# Patient Record
Sex: Male | Born: 1955 | Race: Black or African American | Hispanic: No | Marital: Single | Smoking: Current every day smoker
Health system: Southern US, Community
[De-identification: ages and names within clinical notes are randomized; demographics above are authoritative.]

## PROBLEM LIST (undated history)

## (undated) DIAGNOSIS — D472 Monoclonal gammopathy: Secondary | ICD-10-CM

## (undated) DIAGNOSIS — F1010 Alcohol abuse, uncomplicated: Secondary | ICD-10-CM

## (undated) DIAGNOSIS — R480 Dyslexia and alexia: Secondary | ICD-10-CM

## (undated) DIAGNOSIS — B182 Chronic viral hepatitis C: Secondary | ICD-10-CM

## (undated) HISTORY — DX: Chronic viral hepatitis C: B18.2

## (undated) HISTORY — DX: Dyslexia and alexia: R48.0

## (undated) HISTORY — DX: Alcohol abuse, uncomplicated: F10.10

## (undated) HISTORY — DX: Monoclonal gammopathy: D47.2

---

## 2016-07-17 ENCOUNTER — Ambulatory Visit (INDEPENDENT_AMBULATORY_CARE_PROVIDER_SITE_OTHER): Payer: Self-pay | Admitting: Urology

## 2016-07-17 DIAGNOSIS — R972 Elevated prostate specific antigen [PSA]: Secondary | ICD-10-CM

## 2016-07-17 DIAGNOSIS — N3281 Overactive bladder: Secondary | ICD-10-CM

## 2016-07-31 ENCOUNTER — Other Ambulatory Visit: Payer: Self-pay | Admitting: Urology

## 2016-07-31 DIAGNOSIS — R972 Elevated prostate specific antigen [PSA]: Secondary | ICD-10-CM

## 2016-08-14 ENCOUNTER — Ambulatory Visit (HOSPITAL_COMMUNITY)
Admission: RE | Admit: 2016-08-14 | Discharge: 2016-08-14 | Disposition: A | Discharge status: Home / Self Care | Payer: Medicaid Other | Source: Ambulatory Visit | Attending: Urology | Admitting: Urology

## 2016-08-14 ENCOUNTER — Encounter (HOSPITAL_COMMUNITY): Payer: Self-pay

## 2016-09-04 ENCOUNTER — Ambulatory Visit (HOSPITAL_COMMUNITY): Admission: RE | Admit: 2016-09-04 | Payer: Medicaid Other | Source: Ambulatory Visit

## 2017-09-30 HISTORY — PX: COLON SURGERY: SHX602

## 2017-12-27 ENCOUNTER — Telehealth: Payer: Self-pay | Admitting: Gastroenterology

## 2017-12-27 NOTE — Telephone Encounter (Signed)
Routing back to Dustin Booker 

## 2017-12-27 NOTE — Telephone Encounter (Signed)
Per Erline Levine they will refax the referral

## 2017-12-27 NOTE — Telephone Encounter (Signed)
[facility] called and said they were told we had the referral over a week ago and was inquiring about an appointment for the patient. [phone]

## 2017-12-30 NOTE — Telephone Encounter (Signed)
Referral received and patient scheduled for appointment

## 2018-01-08 ENCOUNTER — Ambulatory Visit: Payer: Medicaid Other | Admitting: Gastroenterology

## 2018-01-13 ENCOUNTER — Encounter: Payer: Self-pay | Admitting: Gastroenterology

## 2018-01-13 ENCOUNTER — Ambulatory Visit (INDEPENDENT_AMBULATORY_CARE_PROVIDER_SITE_OTHER): Payer: Medicaid Other | Admitting: Gastroenterology

## 2018-01-13 DIAGNOSIS — B182 Chronic viral hepatitis C: Secondary | ICD-10-CM | POA: Insufficient documentation

## 2018-01-13 NOTE — Patient Instructions (Signed)
1. Please have your labs and ultrasound done as directed.  2. Once we have results back we will start approval process for hepatitis C drug. 3. Please avoid sharing needles, razors, nail clippers, toothbrushes. Don't sharp pipes, cigarettes.  4. When you start the hepatitis C medication, it is very important to take the medication every day as directed, DO NOT start NEW medications without approving with [place] first to make sure it doesn't interfere with the hepatitis C medication, have your labs done as requested and keep all appointments.  5. Please complete your vaccinations the family doctor is giving you.    Hepatitis C Hepatitis C is a viral infection of the liver. It can lead to scarring of the liver (cirrhosis), liver failure, or liver cancer. Hepatitis C may go undetected for months or years because people with the infection may not have symptoms, or they may have only mild symptoms. What are the causes? This condition is caused by the hepatitis C virus (HCV). The virus can spread from person to person (is contagious) through:  Blood.  Childbirth. A woman who has hepatitis C can pass it to her baby during birth.  Bodily fluids, such as breast milk, tears, semen, vaginal fluids, and saliva.  Blood transfusions or organ transplants done in the [place] before 1992.  What increases the risk? The following factors may make you more likely to develop this condition:  Having contact with unclean (contaminated) needles or syringes. This may result from: ? Acupuncture. ? Tattoing. ? Body piercing. ? Injecting drugs.  Having unprotected sex with someone who is infected.  Needing treatment to filter your blood (kidney dialysis).  Having HIV (human immunodeficiency virus) or AIDS (acquired immunodeficiency syndrome).  Working in a job that involves contact with blood or bodily fluids, such as health care.  What are the signs or symptoms? Symptoms of this condition  include:  Fatigue.  Loss of appetite.  Nausea.  Vomiting.  Abdominal pain.  Dark yellow urine.  Yellowish skin and eyes (jaundice).  Itchy skin.  Clay-colored bowel movements.  Joint pain.  Bleeding and bruising easily.  Fluid building up in your stomach (ascites).  In some cases, you may not have any symptoms. How is this diagnosed? This condition is diagnosed with:  Blood tests.  Other tests to check how well your liver is functioning. They may include: ? Magnetic resonance elastography (MRE). This imaging test uses MRIs and sound waves to measure liver stiffness. ? Transient elastography. This imaging test uses ultrasounds to measure liver stiffness. ? Liver biopsy. This test requires taking a small tissue sample from your liver to examine it under a microscope.  How is this treated? Your health care provider may perform noninvasive tests or a liver biopsy to help decide the best course of treatment. Treatment may include:  Antiviral medicines and other medicines.  Follow-up treatments every 6-12 months for infections or other liver conditions.  Receiving a donated liver (liver transplant).  Follow these instructions at home: Medicines  Take over-the-counter and prescription medicines only as told by your health care provider.  Take your antiviral medicine as told by your health care provider. Do not stop taking the antiviral even if you start to feel better.  Do not take any medicines unless approved by your health care provider, including over-the-counter medicines and birth control pills. Activity  Rest as needed.  Do not have sex unless approved by your health care provider.  Ask your health care provider when you may  return to school or work. Eating and drinking  Eat a balanced diet with plenty of fruits and vegetables, whole grains, and lowfat (lean) meats or non-meat proteins (such as beans or tofu).  Drink enough fluids to keep your urine  clear or pale yellow.  Do not drink alcohol. General instructions  Do not share toothbrushes, nail clippers, or razors.  Wash your hands frequently with soap and water. If soap and water are not available, use hand sanitizer.  Cover any cuts or open sores on your skin to prevent spreading the virus.  Keep all follow-up visits as told by your health care provider. This is important. You may need follow-up visits every 6-12 months. How is this prevented? There is no vaccine for hepatitis C. The only way to prevent the disease is to reduce the risk of exposure to the virus. Make sure you:  Wash your hands frequently with soap and water. If soap and water are not available, use hand sanitizer.  Do not share needles or syringes.  Practice safe sex and use condoms.  Avoid handling blood or bodily fluids without gloves or other protection.  Avoid getting tattoos or piercings in shops or other locations that are not clean.  Contact a health care provider if:  You have a fever.  You develop abdominal pain.  You pass dark urine.  You pass clay-colored stools.  You develop joint pain. Get help right away if:  You have increasing fatigue or weakness.  You lose your appetite.  You cannot eat or drink without vomiting.  You develop jaundice or your jaundice gets worse.  You bruise or bleed easily. Summary  Hepatitis C is a viral infection of the liver. It can lead to scarring of the liver (cirrhosis), liver failure, or liver cancer.  The hepatitis C virus (HCV) causes this condition. The virus can pass from person to person (is contagious).  You should not take any medicines unless approved by your health care provider. This includes over-the-counter medicines and birth control pills. This information is not intended to replace advice given to you by your health care provider. Make sure you discuss any questions you have with your health care provider. Document Released:  03/16/2000 Document Revised: 04/24/2016 Document Reviewed: 04/24/2016 Elsevier Interactive Patient Education  [employer].

## 2018-01-13 NOTE — Progress Notes (Addendum)
Primary Care Physician:  Chapman Fitch, MD Referring provider: Dr. Silvestre Mesi, MD Primary Gastroenterologist:  Barney Drain, MD   Chief Complaint  Patient presents with  . Hepatitis C    HPI:  Dustin Booker is a 62 y.o. male with history of alcohol use, dyslexia, sleep apnea, monoclonal gammopathy here at the request of Dr. Federico Flake at [facility] in [place] for further management of chronic HCV.   Patient has h/o right hemicolectomy for intussusception around 09/2017.  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  Surgery performed by Dr. Burke Keels.  He had subsequently had a postoperative colonoscopy which was unremarkable, by Dr. Burke Keels.  Dr. Federico Flake has obtained multiple labs along the way and detected positive hepatitis C antibody.  Genotype 2B, HCVRNA 64,446 IU/mL.  Patient has been encouraged to decrease alcohol consumption which he has been doing.  Has not completely quit however.  Drinks a 6 pack about every 3 days. May go few days at a time without beer. No liquor.  He also underwent bone marrow biopsy and aspirate back in August with no evidence of multiple myeloma.  Being followed closely by oncology.  Patient states prior to this year he did not know he had hepatitis C.  He has remote illicit drug use including injectable/intranasal use.  His roommate had hepatitis C and apparently was recently treated.  He states that they did share razors and nail clippers.  Patient has never received a blood transfusion.  Does not have tattoos.  He is interested in being treated.  He denies any abdominal pain.  No bowel concerns.  Stools are little bit loose sometimes since his surgery.  No rectal bleeding.  No heartburn or indigestion.  No dysphagia.  Abdominal u/s 10/2017: Liver unremarkable.   He has received 2 of 3 Hep B vaccinations, started  10/2017.  Current Outpatient Medications  Medication Sig Dispense Refill  . HYDROcodone-acetaminophen (NORCO/VICODIN) 5-325 MG tablet Take 1 tablet by mouth as needed.    Marland Kitchen amitriptyline (ELAVIL) 10 MG tablet Take 1 tablet by mouth daily.  2  . b complex vitamins capsule Take 1 capsule by mouth daily.     No current facility-administered medications for this visit.     Allergies as of 01/13/2018  . (No Known Allergies)    Past Medical History:  Diagnosis Date  . Alcohol abuse   . Chronic hepatitis C (HCC)    Genotype 2B  . Dyslexia   . Monoclonal gammopathy     Past Surgical History:  Procedure Laterality Date  . COLON SURGERY  09/2017   Burke Keels.    History reviewed. No pertinent family history.  Social History   Socioeconomic History  . Marital status: Single    Spouse name: Not on file  . Number of children: Not on file  . Years of education: Not on file  . Highest education level: Not on file  Occupational History  . Not on file  Social Needs  . Financial resource strain: Not on file  . Food insecurity:    Worry: Not on file    Inability: Not on file  . Transportation needs:    Medical: Not on file    Non-medical: Not on file  Tobacco Use  . Smoking status: Current Every Day Smoker    Types: Cigarettes  . Smokeless tobacco: Never Used  Substance and Sexual Activity  .  Alcohol use: Yes    Comment: 6 pack beer every other day  . Drug use: Not Currently  . Sexual activity: Not on file  Lifestyle  . Physical activity:    Days per week: Not on file    Minutes per session: Not on file  . Stress: Not on file  Relationships  . Social connections:    Talks on phone: Not on file    Gets together: Not on file    Attends religious service: Not on file    Active member of club or organization: Not on file    Attends meetings of clubs or organizations: Not on file    Relationship status: Not on file  . Intimate partner violence:    Fear of current or  ex partner: Not on file    Emotionally abused: Not on file    Physically abused: Not on file    Forced sexual activity: Not on file  Other Topics Concern  . Not on file  Social History Narrative  . Not on file      ROS:  General: Negative for anorexia, weight loss, fever, chills, fatigue, weakness. Eyes: Negative for vision changes.  ENT: Negative for hoarseness, difficulty swallowing , nasal congestion. CV: Negative for chest pain, angina, palpitations, dyspnea on exertion, peripheral edema.  Respiratory: Negative for dyspnea at rest, dyspnea on exertion, cough, sputum, wheezing.  GI: See history of present illness. GU:  Negative for dysuria, hematuria, urinary incontinence, urinary frequency, nocturnal urination.  MS: Negative for joint pain, low back pain.  Derm: Negative for rash or itching.  Neuro: Negative for weakness, abnormal sensation, seizure, frequent headaches, memory loss, confusion.  Psych: Negative for anxiety, depression, suicidal ideation, hallucinations.  Endo: Negative for unusual weight change.  Heme: Negative for bruising or bleeding. Allergy: Negative for rash or hives.    Physical Examination:  BP 100/67   Pulse (!) 103   Temp (!) 97.2 F (36.2 C) (Oral)   Ht 5' 11.5" (1.816 m)   Wt 127 lb 9.6 oz (57.9 kg)   BMI 17.55 kg/m    General: Well-nourished, well-developed in no acute distress.  Accompanied by sister Head: Normocephalic, atraumatic.   Eyes: Conjunctiva pink, no icterus. Mouth: Oropharyngeal mucosa moist and pink , no lesions erythema or exudate. Neck: Supple without thyromegaly, masses, or lymphadenopathy.  Lungs: Clear to auscultation bilaterally.  Heart: Regular rate and rhythm, no murmurs rubs or gallops.  Abdomen: Bowel sounds are normal, nontender, nondistended, no hepatosplenomegaly or masses, no abdominal bruits or    hernia , no rebound or guarding.   Rectal: Not performed Extremities: No lower extremity edema. No clubbing or  deformities.  Neuro: Alert and oriented x 4 , grossly normal neurologically.  Skin: Warm and dry, no rash or jaundice.   Psych: Alert and cooperative, normal mood and affect.  Labs: 01/06/2018: White blood cell count 8300, hemoglobin 14.4, hematocrit 41.8, MCV 92, platelets 317,000, glucose 121, BUN 7, creatinine 0.95, calcium 10, albumin 4.1, total bilirubin 0.9, alkaline phosphatase 87, AST 42, ALT 16, ferritin 124.  July 2019: Hepatitis C genotype 2B, HCVRNA 64,446 IU/mL, hepatitis A IgM negative, hepatitis B surface antigen negative, hepatitis B core IgM negative, HIV negative.  PSA 7.2. CEA 4.2.  Imaging Studies: No results found.

## 2018-01-13 NOTE — Patient Instructions (Signed)
PA for [place] abd RUQ submitted via [employer]. Case approved. PA# [id number], 01/13/18-02/12/18. Elastography doesn't require PA.

## 2018-01-14 ENCOUNTER — Ambulatory Visit: Payer: Medicaid Other | Admitting: Urology

## 2018-01-14 ENCOUNTER — Encounter: Payer: Self-pay | Admitting: Gastroenterology

## 2018-01-14 DIAGNOSIS — N401 Enlarged prostate with lower urinary tract symptoms: Secondary | ICD-10-CM | POA: Diagnosis not present

## 2018-01-14 DIAGNOSIS — R972 Elevated prostate specific antigen [PSA]: Secondary | ICD-10-CM | POA: Diagnosis not present

## 2018-01-14 DIAGNOSIS — R351 Nocturia: Secondary | ICD-10-CM

## 2018-01-14 NOTE — Assessment & Plan Note (Addendum)
62 year old gentleman presenting for further management of chronic hepatitis C, genotype 2B, treatment nave without evidence of cirrhosis.  History of frequent alcohol abuse.  Patient trying to cut back.  Risk factors for hepatitis C include remote illicit drug use.  Current roommate with Hep C, reportedly treated/cured recently.  Patient recalls sharing razor blades, nail clippers with roommate.  I have discussed precautionary measures to prevent spread of hepatitis C.  We discussed need for further reduction in alcohol use.  Would like to pursue hepatitis C treatment in the near future, additional labs and elastography needed.  We discussed need for compliance with taking medication regularly, calling us prior to adding any additional medications either prescription or over-the-counter, need to keep follow-up appointments.  Await pending labs and elastography.

## 2018-01-15 ENCOUNTER — Other Ambulatory Visit: Payer: Self-pay | Admitting: Urology

## 2018-01-15 DIAGNOSIS — R972 Elevated prostate specific antigen [PSA]: Secondary | ICD-10-CM

## 2018-01-15 NOTE — Progress Notes (Signed)
No pcp per patient 

## 2018-01-20 ENCOUNTER — Ambulatory Visit (HOSPITAL_COMMUNITY): Payer: Medicaid Other

## 2018-01-20 NOTE — Progress Notes (Signed)
cc'ed to [employer] and [employer]

## 2018-01-20 NOTE — Progress Notes (Signed)
Patient has been seeing Dr. Chapman Fitch. Please send copy to Dr. Lorra Hals and Dr. Federico Flake.

## 2018-01-20 NOTE — Progress Notes (Signed)
cc'd to pcp 

## 2018-01-22 ENCOUNTER — Ambulatory Visit (HOSPITAL_COMMUNITY)
Admission: RE | Admit: 2018-01-22 | Discharge: 2018-01-22 | Disposition: A | Discharge status: Home / Self Care | Payer: Medicaid Other | Source: Ambulatory Visit | Attending: Gastroenterology | Admitting: Gastroenterology

## 2018-01-22 DIAGNOSIS — B182 Chronic viral hepatitis C: Secondary | ICD-10-CM

## 2018-01-24 ENCOUNTER — Ambulatory Visit (HOSPITAL_COMMUNITY)
Admission: RE | Admit: 2018-01-24 | Discharge: 2018-01-24 | Disposition: A | Discharge status: Home / Self Care | Payer: Medicaid Other | Source: Ambulatory Visit | Attending: Gastroenterology | Admitting: Gastroenterology

## 2018-01-24 DIAGNOSIS — B182 Chronic viral hepatitis C: Secondary | ICD-10-CM | POA: Diagnosis not present

## 2018-01-30 ENCOUNTER — Telehealth: Payer: Self-pay | Admitting: Gastroenterology

## 2018-01-30 NOTE — Telephone Encounter (Signed)
I called pt and left Vm for a return call. Need to find out if his labs have been done.

## 2018-01-30 NOTE — Telephone Encounter (Signed)
PATIENT CALLED AND SAID THAT THE PROVIDER HERE WAS SUPPOSED TO CALL SOME MEDICATION IN TO TAKE  BUT THEY WERE UNAWARE OF THE NAME OR WHAT IT WAS FOR.  THOUGHT THAT IT NEEDED "APPROVAL" BEFORE IT COULD BE SENT IN.  PLEASE GIVE THEM A CALL

## 2018-02-03 NOTE — Telephone Encounter (Signed)
I spoke to [place] at [facility]. The last labs they have on pt was CBC, CMP and Ferritin done on 01/06/2018 and ordered by another physician.

## 2018-02-03 NOTE — Telephone Encounter (Signed)
I called [facility], just to make sure and spoke to [place] who said they are not showing any orders or results for pt.

## 2018-02-03 NOTE — Telephone Encounter (Signed)
I called [facility] lab and spoke to [facility]. She said nothing is showing in their computer. She said for me to call [facility].

## 2018-02-03 NOTE — Telephone Encounter (Signed)
FYI to Leslie Lewis, PA.  

## 2018-02-03 NOTE — Telephone Encounter (Signed)
He completed the u/s and elastography but has not completed labs that were sent to [facility]. Await for patient to have done as per DS documentation.

## 2018-02-03 NOTE — Telephone Encounter (Signed)
Pt's wife said he had them done at the hospital [facility]) about 2 weeks ago. I will check on results.

## 2018-02-03 NOTE — Telephone Encounter (Signed)
I spoke to pt's wife and informed her that the labs that [facility] ordered have not been completed. She is aware of the labs that were done on 01/06/2018 by another physician. She will have pt go to the lab in the next few days. I am faxing the labs to [facility] also.

## 2018-02-03 NOTE — Telephone Encounter (Signed)
TO LL.

## 2018-02-04 NOTE — Telephone Encounter (Signed)
Noted  

## 2018-02-10 NOTE — Progress Notes (Signed)
LMOM ( both numbers) to call.

## 2018-02-10 NOTE — Progress Notes (Signed)
I called the hospital and no record of labs being done there. I looked on [facility] and no record of labs. I called [facility] and spoke to Orie Rout and no labs done there. I have called pt back and LMOM for a return call.

## 2018-02-10 NOTE — Progress Notes (Signed)
Pt is aware of results. Dustin Booker he did his labs on the following Monday after his office visit ( which would have been 01/20/2018). He did them at the hospital. I will check on those.

## 2018-02-11 NOTE — Progress Notes (Signed)
LMOM to call. Also, mailing a letter to pt with lab orders. Asking him to please complete since we have been unable to locate these results.

## 2018-02-25 ENCOUNTER — Encounter (HOSPITAL_COMMUNITY): Payer: Self-pay

## 2018-02-25 ENCOUNTER — Ambulatory Visit (HOSPITAL_COMMUNITY): Payer: Medicaid Other

## 2018-04-07 NOTE — Telephone Encounter (Signed)
Patient's sister is coming this Thursday by to pick up labs so Mr. Morelos can have them drawn.

## 2018-04-08 ENCOUNTER — Telehealth: Payer: Self-pay | Admitting: Gastroenterology

## 2018-04-08 NOTE — Telephone Encounter (Signed)
Pt's sister was going to stop by and pick up the lab orders on the patient and take him to have them done today. Can you print a copy and bring up front?

## 2018-04-08 NOTE — Telephone Encounter (Signed)
Lab orders printed and left at front for pt.

## 2018-04-10 LAB — HCV FIBROSURE
ALPHA 2-MACROGLOBULINS, QN: 184 mg/dL (ref 110–276)
ALT (SGPT) P5P: 92 IU/L — AB (ref 0–55)
APOLIPOPROTEIN A I: 204 mg/dL — AB (ref 101–178)
Bilirubin, Total: 1.3 mg/dL — ABNORMAL HIGH (ref 0.0–1.2)
Fibrosis Score: 0.67 — ABNORMAL HIGH (ref 0.00–0.21)
GGT: 198 IU/L — ABNORMAL HIGH (ref 0–65)
HAPTOGLOBIN: 27 mg/dL — AB (ref 29–370)
NECROINFLAMMAT ACTIVITY SCORE: 0.67 — AB (ref 0.00–0.17)

## 2018-04-10 LAB — HEPATITIS B CORE ANTIBODY, TOTAL: Hep B Core Total Ab: POSITIVE — AB

## 2018-04-10 LAB — HEPATITIS B SURFACE ANTIBODY,QUALITATIVE: Hep B Surface Ab, Qual: REACTIVE

## 2018-04-10 LAB — HEPATITIS A ANTIBODY, TOTAL: HEP A TOTAL AB: POSITIVE — AB

## 2018-04-30 ENCOUNTER — Telehealth: Payer: Self-pay | Admitting: Gastroenterology

## 2018-04-30 NOTE — Telephone Encounter (Signed)
Pt's sister, Stanton Kidney, called to see if patient's lab results were back yet. Please call [phone]

## 2018-05-01 NOTE — Telephone Encounter (Signed)
Forwarding to Leslie Lewis, PA for results.  

## 2018-05-04 NOTE — Telephone Encounter (Signed)
Please let sister know that labs are showing some fibrosis in the liver. Evidence of prior Hep A and B with immunity.   I am going to discuss his case with [facility] liver care in [place]. He may need to go down to [facility] for treatment given liver fibrosis and other medical issues. Further recommendations to follow.

## 2018-05-05 NOTE — Telephone Encounter (Signed)
LMOM for a return call.  

## 2018-05-06 NOTE — Telephone Encounter (Signed)
PT's sister Stanton Kidney is aware.

## 2018-05-20 NOTE — Telephone Encounter (Signed)
Spoke with [facility] liver care, Dawn Drazek.   Discussed case, patient is appropriate for HCV management here locally.   Given gray zone if F2/F3, she advises annual liver u/s and ov to evaluate for hepatoma and due to ongoing etoh use. Once HCV treatment complete.   Lets bring in back in for follow up ov to get him started on HEP C treatment. Would choose Epclusa over [place] given ongoing etoh use. Per Dawn, avoid etoh and [place].  May use urgent OV IF needed.

## 2018-05-20 NOTE — Telephone Encounter (Signed)
LMOM to call.

## 2018-05-21 NOTE — Telephone Encounter (Signed)
Left Vm that I have a very important message for him and to please return the call.  Mailing a letter to call also.

## 2018-06-02 NOTE — Telephone Encounter (Signed)
Noted  

## 2018-06-02 NOTE — Telephone Encounter (Signed)
Noted. Will forward information to AB for review given upcoming OV on her schedule.

## 2018-06-02 NOTE — Telephone Encounter (Signed)
Pt came by the office and was informed of the message. He preferred a Monday.  He was scheduled an OV appt for 06/30/2018 with Roseanne Kaufman, NP.

## 2018-06-30 ENCOUNTER — Encounter: Payer: Self-pay | Admitting: Gastroenterology

## 2018-06-30 ENCOUNTER — Other Ambulatory Visit: Payer: Self-pay

## 2018-06-30 ENCOUNTER — Ambulatory Visit (INDEPENDENT_AMBULATORY_CARE_PROVIDER_SITE_OTHER): Payer: Medicaid Other | Admitting: Gastroenterology

## 2018-06-30 DIAGNOSIS — B182 Chronic viral hepatitis C: Secondary | ICD-10-CM

## 2018-06-30 NOTE — Progress Notes (Addendum)
      Virtual Visit via Telephone Note Due to COVID-19, visit is conducted virtually and was requested by patient.   I connected with Dustin Booker on 06/30/18 at  2:00 PM EDT by telephone and verified that I am speaking with the correct person using two identifiers.   I discussed the limitations, risks, security and privacy concerns of performing an evaluation and management service by telephone and the availability of in person appointments. I also discussed with the patient that there may be a patient responsible charge related to this service. The patient expressed understanding and agreed to proceed.  Chief Complaint  Patient presents with  . Hepatitis C     History of Present Illness: History of chronic Hep C genotype 2b, Metavir score F2/F3 with need for annual ultrasound surveillance in light of elevated fibrosis score and alcohol use. Next [place] due in Oct 2020. Plans for Epclusa for treatment. Immune to Hep A and B.   No abdominal pain, jaundice, confusion. Appetite has picked up. No dysphagia. No constipation or diarrhea. No overt GI bleeding. Not drinking alcohol. Stopped 2 months ago. Last Hep B vaccination today.   Past Medical History:  Diagnosis Date  . Alcohol abuse   . Chronic hepatitis C (HCC)    Genotype 2B  . Dyslexia   . Monoclonal gammopathy      Past Surgical History:  Procedure Laterality Date  . COLON SURGERY  09/2017   Burke Keels.     Current Meds  Medication Sig  . aspirin 325 MG tablet Take 325 mg by mouth as needed.  Marland Kitchen b complex vitamins capsule Take 1 capsule by mouth. 2-3 times per week  . HYDROcodone-acetaminophen (NORCO/VICODIN) 5-325 MG tablet Take 1 tablet by mouth as needed.       Observations/Objective: No distress. Unable to perform physical exam due to telephone encounter. No video available.   Assessment and Plan: 63 year old male with chronic Hep C genotype 2b, fibrosis F2/F3. Due for [place] in Oct 2020. Applauded on alcohol  cessation. No concerning signs/symptoms. Will pursue Epclusa for 12 weeks. He will need labs 4 weeks into treatment. As no recent labs, I am also updating these today. We will have medication delivered her to the office so we may provide updated sheet when it arrives.   Follow Up Instructions: Will submit for Epclusa Labs today including CBC, CMP, INR Will need labs 4 weeks into treatment Next [place] in Oct 2020   I discussed the assessment and treatment plan with the patient. The patient was provided an opportunity to ask questions and all were answered. The patient agreed with the plan and demonstrated an understanding of the instructions.   The patient was advised to call back or seek an in-person evaluation if the symptoms worsen or if the condition fails to improve as anticipated.  I provided 12 minutes of non-face-to-face time during this encounter. Video capabilities unable at time of visit.   Annitta Needs, PhD, ANP-BC [facility]

## 2018-06-30 NOTE — Patient Instructions (Signed)
Please have blood work done on Monday.   We will go ahead and submit for Epclusa to treat Hepatitis C.  Continue the great work on avoiding alcohol!  When the medication arrives, we will have you pick it up with specific instructions.  Further recommendations to follow!  It was a pleasure to talk to you today. I strive to create trusting relationships with patients to provide genuine, compassionate, and quality care. I value your feedback. If you receive a survey regarding your visit,  I greatly appreciate you taking time to fill this out.   Annitta Needs, PhD, ANP-BC [facility]

## 2018-07-01 ENCOUNTER — Encounter: Payer: Self-pay | Admitting: Gastroenterology

## 2018-07-01 NOTE — Progress Notes (Signed)
CC'ED TO PCP 

## 2018-08-19 ENCOUNTER — Telehealth: Payer: Self-pay

## 2018-08-19 NOTE — Telephone Encounter (Signed)
I am mailing a letter and Medicaid form to be completed by pt to do the Epclusa.  I also put a note at the bottom of the letter to please to the labs that were ordered if he has not done so.  Will put the form for the Epclusa on desk for Dustin Booker to complete.

## 2018-08-20 NOTE — Telephone Encounter (Signed)
Dustin Booker: I have the form and will fill out, but we need the labs done that were ordered at last visit for me to review prior to submitting. I want to get a baseline. He has immunity to Hep B due to natural infection; we will need to follow his labs closely through treatment (4 weeks, 12 weeks, and 3 months post-treatment.

## 2018-08-21 NOTE — Telephone Encounter (Signed)
PT's sister, Ronne Binning, left Vm for a return call @ [phone]. I called and got her VM and left message for a return call.

## 2018-08-26 NOTE — Telephone Encounter (Signed)
I spoke to pt's sister, Stanton Kidney and she will take him to [facility] for his labs. Also, I told Stanton Kidney to check with him and see if he got the Medicaid form to complete so they will pay for his Hep C med. She said she will ask him, he has not mentioned it to her.

## 2018-08-28 LAB — CBC WITH DIFFERENTIAL/PLATELET
BASOS: 1 %
Basophils Absolute: 0 10*3/uL (ref 0.0–0.2)
EOS (ABSOLUTE): 0.1 10*3/uL (ref 0.0–0.4)
EOS: 1 %
HEMOGLOBIN: 13.5 g/dL (ref 13.0–17.7)
Hematocrit: 40 % (ref 37.5–51.0)
IMMATURE GRANULOCYTES: 0 %
Immature Grans (Abs): 0 10*3/uL (ref 0.0–0.1)
LYMPHS: 26 %
Lymphocytes Absolute: 1.9 10*3/uL (ref 0.7–3.1)
MCH: 32.9 pg (ref 26.6–33.0)
MCHC: 33.8 g/dL (ref 31.5–35.7)
MCV: 98 fL — ABNORMAL HIGH (ref 79–97)
MONOCYTES: 13 %
Monocytes Absolute: 0.9 10*3/uL (ref 0.1–0.9)
Neutrophils Absolute: 4.3 10*3/uL (ref 1.4–7.0)
Neutrophils: 59 %
Platelets: 299 10*3/uL (ref 150–450)
RBC: 4.1 x10E6/uL — ABNORMAL LOW (ref 4.14–5.80)
RDW: 13.1 % (ref 11.6–15.4)
WBC: 7.2 10*3/uL (ref 3.4–10.8)

## 2018-08-28 LAB — COMPREHENSIVE METABOLIC PANEL
ALBUMIN: 4.1 g/dL (ref 3.8–4.8)
ALT: 36 IU/L (ref 0–44)
AST: 88 IU/L — ABNORMAL HIGH (ref 0–40)
Albumin/Globulin Ratio: 1.3 (ref 1.2–2.2)
Alkaline Phosphatase: 85 IU/L (ref 39–117)
BUN/Creatinine Ratio: 7 — ABNORMAL LOW (ref 10–24)
BUN: 5 mg/dL — ABNORMAL LOW (ref 8–27)
Bilirubin Total: 0.4 mg/dL (ref 0.0–1.2)
CO2: 19 mmol/L — ABNORMAL LOW (ref 20–29)
Calcium: 9.2 mg/dL (ref 8.6–10.2)
Chloride: 101 mmol/L (ref 96–106)
Creatinine, Ser: 0.72 mg/dL — ABNORMAL LOW (ref 0.76–1.27)
GFR calc Af Amer: 116 mL/min/{1.73_m2} (ref >59)
GFR calc non Af Amer: 101 mL/min/{1.73_m2} (ref >59)
GLOBULIN, TOTAL: 3.1 g/dL (ref 1.5–4.5)
Glucose: 75 mg/dL (ref 65–99)
Potassium: 4 mmol/L (ref 3.5–5.2)
SODIUM: 138 mmol/L (ref 134–144)
Total Protein: 7.2 g/dL (ref 6.0–8.5)

## 2018-08-28 LAB — PROTIME-INR
INR: 0.9 (ref 0.8–1.2)
Prothrombin Time: 10.1 s (ref 9.1–12.0)

## 2018-09-02 ENCOUNTER — Ambulatory Visit: Payer: Medicaid Other | Admitting: Urology

## 2018-09-10 NOTE — Progress Notes (Signed)
Labs reviewed. Please submit for Epclusa as planned.

## 2018-09-17 NOTE — Progress Notes (Signed)
Paperwork has been faxed to [facility].  

## 2018-09-17 NOTE — Progress Notes (Signed)
Paperwork on Dustin Booker's desk to sign.  

## 2018-09-19 NOTE — Telephone Encounter (Signed)
Per Roseanne Kaufman, NP, generic [name] is OK. I called and spoke to the pharmacist, [facility] and informed her.

## 2018-09-19 NOTE — Telephone Encounter (Signed)
VM left on 09/17/2018. Contact person Danae Chen [phone]. Danae Chen is trying to process pt's Hep C medication. RX was written dispense as written and pt's insurance will cover the generic of Epclusa. Danae Chen needs a verbal that it's ok to fill the generic Epclusa.

## 2018-09-23 ENCOUNTER — Telehealth: Payer: Self-pay

## 2018-09-23 NOTE — Telephone Encounter (Signed)
I received a fax from [facility] that they have been trying to contact pt about his delivery. I called [facility] and spoke to Perla in the pharmacy to confirm they are aware to deliver the medication here to the office. She said they are aware and she will make another note also, but they need to speak to the pt. I called the pt and left Vm for a return call and also called his sister, Ronne Binning and left the number for [place] for him to call, [phone].

## 2018-09-25 ENCOUNTER — Telehealth: Payer: Self-pay

## 2018-09-25 NOTE — Telephone Encounter (Signed)
Dustin Booker, the pt's [name] has arrived.   Please provide instructions.

## 2018-09-29 ENCOUNTER — Telehealth: Payer: Self-pay | Admitting: Family

## 2018-09-29 NOTE — Telephone Encounter (Signed)
Take Epclusa once each morning with or without food. Do not take any OTC medications, acid reducers/antacids, etc. Call us prior to taking any new medications. Ensure he is still only taking aspirin, b complex, and Norco. Needs office visit in 4 weeks with CBC, HFP, BMP, INR at that time. May use urgent.

## 2018-09-29 NOTE — Telephone Encounter (Signed)
LMOM for a return call.  

## 2018-09-30 NOTE — Progress Notes (Deleted)
Referring Provider: Sandi Mealy, MD Primary Care Physician:  Sandi Mealy, MD  No chief complaint on file.   HPI:   Dustin Booker is a 63 y.o. male presenting today with a history of chronic hepatitis C, genotype 2b diagnosed in July 2019, Metavir score F2/F3 with need for annual ultrasound surveillance in light of elevated fibrosis score and alcohol use. Next [place] due in Oct 2020. Immune to Hep A and Hep B due to natural infection. Patient also completed Hep B vaccination series in March 2020.  Patient last seen in office in March 2020 and was doing well at that time, had stopped drinking alcohol, with plans for [facility] treatment. Recent labs on 08/27/18 with PT/INR normal, CBC essentially normal, CMP with AST elevated at 88.   Patient presenting today to initiate treatment with Epclusa.     Need to follow labs at 4 weeks, 12 weeks, and 3 months post treatment.     ? Labs needed at 4 weeks. CBC, HFP, BMP, INR, Hep B surface antigen and HBV DNA, quantitative Hep C viral load. Does patient need to return in 4 weeks to office? Are meds mailed to patient?   Past Medical History:  Diagnosis Date  . Alcohol abuse   . Chronic hepatitis C (HCC)    Genotype 2B  . Dyslexia   . Monoclonal gammopathy     Past Surgical History:  Procedure Laterality Date  . COLON SURGERY  09/2017   Burke Keels.    Current Outpatient Medications  Medication Sig Dispense Refill  . aspirin 325 MG tablet Take 325 mg by mouth as needed.    Marland Kitchen b complex vitamins capsule Take 1 capsule by mouth. 2-3 times per week    . HYDROcodone-acetaminophen (NORCO/VICODIN) 5-325 MG tablet Take 1 tablet by mouth as needed.     No current facility-administered medications for this visit.     Allergies as of 10/01/2018  . (No Known Allergies)    Family History  Problem Relation Age of Onset  . Colon cancer Neg Hx     Social History   Socioeconomic History  . Marital status: Single    Spouse  name: Not on file  . Number of children: Not on file  . Years of education: Not on file  . Highest education level: Not on file  Occupational History  . Not on file  Social Needs  . Financial resource strain: Not on file  . Food insecurity    Worry: Not on file    Inability: Not on file  . Transportation needs    Medical: Not on file    Non-medical: Not on file  Tobacco Use  . Smoking status: Current Every Day Smoker    Packs/day: 0.50    Types: Cigarettes  . Smokeless tobacco: Never Used  Substance and Sexual Activity  . Alcohol use: Not Currently    Comment: 6 pack beer every other day  . Drug use: Not Currently  . Sexual activity: Not on file  Lifestyle  . Physical activity    Days per week: Not on file    Minutes per session: Not on file  . Stress: Not on file  Relationships  . Social Herbalist on phone: Not on file    Gets together: Not on file    Attends religious service: Not on file    Active member of club or organization: Not on file    Attends meetings of clubs  or organizations: Not on file    Relationship status: Not on file  Other Topics Concern  . Not on file  Social History Narrative  . Not on file    Review of Systems: Gen: Denies fever, chills, anorexia. Denies fatigue, weakness, weight loss.  CV: Denies chest pain, palpitations, syncope, peripheral edema, and claudication. Resp: Denies dyspnea at rest, cough, wheezing, coughing up blood, and pleurisy. GI: Denies vomiting blood, jaundice, and fecal incontinence.   Denies dysphagia or odynophagia. Derm: Denies rash, itching, dry skin Psych: Denies depression, anxiety, memory loss, confusion. No homicidal or suicidal ideation.  Heme: Denies bruising, bleeding, and enlarged lymph nodes.  Physical Exam: There were no vitals taken for this visit. General:   Alert and oriented. No distress noted. Pleasant and cooperative.  Head:  Normocephalic and atraumatic. Eyes:  Conjuctiva clear  without scleral icterus. Mouth:  Oral mucosa pink and moist. Good dentition. No lesions. Heart:  S1, S2 present without murmurs appreciated. Lungs:  Clear to auscultation bilaterally. No wheezes, rales, or rhonchi. No distress.  Abdomen:  +BS, soft, non-tender and non-distended. No rebound or guarding. No HSM or masses noted. Msk:  Symmetrical without gross deformities. Normal posture. Extremities:  Without edema. Neurologic:  Alert and  oriented x4 Psych:  Alert and cooperative. Normal mood and affect.

## 2018-09-30 NOTE — Telephone Encounter (Signed)
LMOM to call.

## 2018-09-30 NOTE — Telephone Encounter (Signed)
Per Stanton Kidney, pt's sister, pt has apt here tomorrow. I will discuss with him then.

## 2018-09-30 NOTE — Assessment & Plan Note (Deleted)
Take Epclusa once each morning with or without food. Do not take any OTC medications, acid reducers/antacids, etc. Call us prior to taking any new medications to ensure it doesn't interfere with Epclusa. Continue to have labs completed as requested and keep all appointments.   Needs office visit in 4 weeks with CBC, HFP, BMP, INR at that time.

## 2018-10-01 ENCOUNTER — Ambulatory Visit: Payer: Medicaid Other | Admitting: Gastroenterology

## 2018-10-01 ENCOUNTER — Other Ambulatory Visit: Payer: Self-pay

## 2018-10-01 DIAGNOSIS — B182 Chronic viral hepatitis C: Secondary | ICD-10-CM

## 2018-10-01 NOTE — Telephone Encounter (Signed)
Per Vicente Males, pt does not need the appointment today for OV.  He just needs to come by for the medication and instructions.  She is speaking to the schedulers about that.  I called pt and left VM with him and his sister for a return call.  I will address his meds to have paperwork ready when he comes for the medication.

## 2018-10-01 NOTE — Telephone Encounter (Signed)
Pt came by and picked up his Epclusa and instructions. Med list was confirmed as ASA, B Complex and Norco all of which he takes prn. He is aware to call us before he takes any new meds OTC or from a provider.  He will start taking Epclusa on 10/06/2018.  His follow up appt is on 11/05/2018 at 10:00 Am with Aliene Altes, PA ( he prefers Wednesday appointments).  He is aware to do his labs a few days prior to that appointment and they will be mailed to him.  Paperwork signed by pt and will be scanned to epic.

## 2018-10-13 ENCOUNTER — Other Ambulatory Visit: Payer: Self-pay

## 2018-10-13 DIAGNOSIS — B182 Chronic viral hepatitis C: Secondary | ICD-10-CM

## 2018-10-20 NOTE — Telephone Encounter (Signed)
Noted  

## 2018-10-20 NOTE — Telephone Encounter (Signed)
Shayla called from [place], pt's Dustin Booker will be delivered on this Wednesday 10/22/18.

## 2018-10-22 NOTE — Telephone Encounter (Signed)
Received the shipment today #28 tablets.

## 2018-10-23 NOTE — Telephone Encounter (Signed)
Called pt, LMOM for a return call.  Called his sister, Stanton Kidney, and could not leave a VM.   If pt started his Epclusa on 10/06/2018 as planned, he will need to pick this up before his appointment on 11/05/2018.

## 2018-10-23 NOTE — Telephone Encounter (Signed)
Mary called and will have pt call me.

## 2018-10-27 NOTE — Telephone Encounter (Signed)
Tried to call pt and [facility]. Left Vm for a return call from [facility].

## 2018-10-30 NOTE — Telephone Encounter (Signed)
Numerous attempts to reach pt.  I called his sister, Stanton Kidney, again and told her he needs to pick up his medication tomorrow before noon so he will not be without prior to his appointment.  She said his phone has been off and she will have him come tomorrow morning, preferably by 10:00 am.

## 2018-10-31 ENCOUNTER — Other Ambulatory Visit: Payer: Self-pay | Admitting: Gastroenterology

## 2018-10-31 NOTE — Telephone Encounter (Signed)
Pt came by office to pick up medication.  He is aware of his appointment on 11/05/2018 at 10:00 AM.   He just had his lab done this morning.

## 2018-11-01 LAB — CBC WITH DIFFERENTIAL/PLATELET
Basophils Absolute: 0 10*3/uL (ref 0.0–0.2)
Basos: 0 %
EOS (ABSOLUTE): 0.2 10*3/uL (ref 0.0–0.4)
Eos: 2 %
Hematocrit: 41.7 % (ref 37.5–51.0)
Hemoglobin: 14.3 g/dL (ref 13.0–17.7)
Immature Grans (Abs): 0 10*3/uL (ref 0.0–0.1)
Immature Granulocytes: 0 %
Lymphocytes Absolute: 1.8 10*3/uL (ref 0.7–3.1)
Lymphs: 22 %
MCH: 34 pg — ABNORMAL HIGH (ref 26.6–33.0)
MCHC: 34.3 g/dL (ref 31.5–35.7)
MCV: 99 fL — ABNORMAL HIGH (ref 79–97)
Monocytes Absolute: 1 10*3/uL — ABNORMAL HIGH (ref 0.1–0.9)
Monocytes: 13 %
Neutrophils Absolute: 5.1 10*3/uL (ref 1.4–7.0)
Neutrophils: 63 %
Platelets: 252 10*3/uL (ref 150–450)
RBC: 4.2 x10E6/uL (ref 4.14–5.80)
RDW: 12 % (ref 11.6–15.4)
WBC: 8.2 10*3/uL (ref 3.4–10.8)

## 2018-11-01 LAB — BASIC METABOLIC PANEL
BUN/Creatinine Ratio: 9 — ABNORMAL LOW (ref 10–24)
BUN: 7 mg/dL — ABNORMAL LOW (ref 8–27)
CO2: 21 mmol/L (ref 20–29)
Calcium: 9.5 mg/dL (ref 8.6–10.2)
Chloride: 102 mmol/L (ref 96–106)
Creatinine, Ser: 0.82 mg/dL (ref 0.76–1.27)
GFR calc Af Amer: 110 mL/min/{1.73_m2} (ref >59)
GFR calc non Af Amer: 95 mL/min/{1.73_m2} (ref >59)
Glucose: 89 mg/dL (ref 65–99)
Potassium: 4 mmol/L (ref 3.5–5.2)
Sodium: 138 mmol/L (ref 134–144)

## 2018-11-01 LAB — HEPATIC FUNCTION PANEL
ALT: 14 IU/L (ref 0–44)
AST: 33 IU/L (ref 0–40)
Albumin: 4.1 g/dL (ref 3.8–4.8)
Alkaline Phosphatase: 72 IU/L (ref 39–117)
Bilirubin Total: 0.4 mg/dL (ref 0.0–1.2)
Bilirubin, Direct: 0.15 mg/dL (ref 0.00–0.40)
Total Protein: 7.1 g/dL (ref 6.0–8.5)

## 2018-11-01 LAB — SPECIMEN STATUS REPORT

## 2018-11-01 LAB — PROTIME-INR
INR: 1 (ref 0.8–1.2)
Prothrombin Time: 10.3 s (ref 9.1–12.0)

## 2018-11-04 ENCOUNTER — Other Ambulatory Visit: Payer: Self-pay | Admitting: Gastroenterology

## 2018-11-04 NOTE — Assessment & Plan Note (Addendum)
63 y.o. male with history of chronic hepatitis C, genotype 2b, baseline HCV RNA 64,446, Metavir score F2/F3 with need for annual ultrasound surveillance. Immune to Hep A and B. Received Hep B vaccination, but also with history of prior Hep B infection with positive Hep B core Ab and surface Ab, negative Surface Ag. Started generic Epclusa on 10/06/18. Admitted to alcohol use on 7/4, but none since. No illicit drug use. Patient has taken medication daily without missing a dose. No side effects to medication. Labs completed on 7/31 with CBC largely normal other than mild macrocytosis, BMP essentially normal, HFP now completely normal, AST was elevated in May 2020, INR 1.0. HCV RNA was not ordered. Will need to get this. No obvious signs/symptoms of advanced liver disease.   Continue Epclusa daily.  HCV RNA Quantitative today HFP in 4 weeks to ensure LFTs are not elevating due to prior Hep B.  Patient will pick up last 4 weeks of medication at the office.  We will follow-up in office in 8 weeks at the end of treatment as patient is having no trouble at this time. Patient to have labs completed prior to this visit including CBC, CMP, HFP, INR, HCV RNA Quantitative.  Reminded patient on importance of not missing any doses, completing his labs, keeping his follow-up appointments, and not starting any new medications prior to calling us.   Next Abdominal US due in October 2020.

## 2018-11-04 NOTE — Progress Notes (Signed)
Referring Provider: Sandi Mealy, MD Primary Care Physician:  Sandi Mealy, MD Primary GI Physician: Dr. Oneida Alar  Chief Complaint  Patient presents with  . Hepatitis C    HPI:   Dustin Booker is a 63 y.o. male presenting today for follow-up. History of chronic Hep C genotype 2b, baseline HCV RNA 64,446, Metavir score F2/F3 with need for annual ultrasound surveillance due to elevated fibrosis score and history of alcohol use. Next [place] due in Oct 2020. Immune to Hep A and B. Hep B core Ab positive in Jan 2020, surface Ab also positive, surface Ag negative. He completed a series of Hep B vaccination with last dose in March 2020. Patient started on generic Epclusa on 10/06/18 with plans to complete 12 weeks.   Labs completed on 7/31 with CBC largely normal other than mild macrocytosis, BMP essentially normal, HFP now completely normal, AST was elevated in May 2020, INR 1.0. HCV RNA was not ordered. Will need to get this.   Today patient states he started his Epclusa on July 6th. He has taken medication every day without missing a dose. He has already picked up he second 4 week regimen. He is having no adverse reaction to the medication. No abdominal pain, nausea, vomiting, GERD symptoms, constipation, or diarrhea. No hematochezia or melena. No fever, chills, fatigue, unintentional weight loss. No confusion, jaundice, swelling in abdomen or lower extremities. Appetite is good. No new medications. Has not taken aspirin since starting Epclusa. Admits to alcohol use on July 4th, but none since. No current drug use.   Past Medical History:  Diagnosis Date  . Alcohol abuse   . Chronic hepatitis C (HCC)    Genotype 2B  . Dyslexia   . Monoclonal gammopathy     Past Surgical History:  Procedure Laterality Date  . COLON SURGERY  09/2017   Burke Keels.    Current Outpatient Medications  Medication Sig Dispense Refill  . b complex vitamins capsule Take 1 capsule by mouth. 2-3 times per  week    . HYDROcodone-acetaminophen (NORCO/VICODIN) 5-325 MG tablet Take 1 tablet by mouth as needed.    . Sofosbuvir-Velpatasvir (EPCLUSA) 400-100 MG TABS Take 1 tablet by mouth daily.     No current facility-administered medications for this visit.     Allergies as of 11/05/2018  . (No Known Allergies)    Family History  Problem Relation Age of Onset  . Colon cancer Neg Hx     Social History   Socioeconomic History  . Marital status: Single    Spouse name: Not on file  . Number of children: Not on file  . Years of education: Not on file  . Highest education level: Not on file  Occupational History  . Not on file  Social Needs  . Financial resource strain: Not on file  . Food insecurity    Worry: Not on file    Inability: Not on file  . Transportation needs    Medical: Not on file    Non-medical: Not on file  Tobacco Use  . Smoking status: Current Every Day Smoker    Packs/day: 0.50    Types: Cigarettes  . Smokeless tobacco: Never Used  Substance and Sexual Activity  . Alcohol use: Not Currently    Comment: None since 7/4//20  . Drug use: Not Currently  . Sexual activity: Not on file  Lifestyle  . Physical activity    Days per week: Not on file  Minutes per session: Not on file  . Stress: Not on file  Relationships  . Social Herbalist on phone: Not on file    Gets together: Not on file    Attends religious service: Not on file    Active member of club or organization: Not on file    Attends meetings of clubs or organizations: Not on file    Relationship status: Not on file  Other Topics Concern  . Not on file  Social History Narrative  . Not on file    Review of Systems: Gen: No lightheadedness, dizziness, pre-syncope or syncope.  CV: Denies chest pain, palpitations Resp: Denies dyspnea at rest, cough, wheezing GI: See HPI Derm: Denies rash, itching, dry skin Psych: Denies depression, anxiety.  Heme: Denies bruising, bleeding   Physical Exam: BP 109/68   Pulse 92   Temp (!) 97 F (36.1 C) (Oral)   Ht 5\' 11"  (1.803 m)   Wt 118 lb (53.5 kg)   BMI 16.46 kg/m  General:   Alert and oriented. No distress noted. Pleasant and cooperative.  Head:  Normocephalic and atraumatic. Eyes:  Conjuctiva somewhat injected, without scleral icterus. States this is normal.  Heart:  S1, S2 present without murmurs appreciated. Lungs:  Clear to auscultation bilaterally. No wheezes, rales, or rhonchi. No distress.  Abdomen:  +BS, soft, non-tender and non-distended. No rebound or guarding. No HSM or masses noted. Msk:  Symmetrical without gross deformities. Normal posture. Extremities:  Without edema. Neurologic:  Alert and  oriented x4 Psych:  Normal mood and affect.

## 2018-11-04 NOTE — Progress Notes (Signed)
Dustin Booker: HCV RNA wasn't completed for some reason. Can we find out from [facility]?

## 2018-11-04 NOTE — Progress Notes (Signed)
I spoke with Katharine Look and the HCV RNA was not ordered and it cannot be added.

## 2018-11-04 NOTE — Patient Instructions (Addendum)
1. Please have labs completed today at [facility]. We will call you with results.  2. Continue taking Epclusa daily ensuring not to miss a dose.  3. I will also have you complete labs in 4 weeks. We will call to remind you.  4. Be sure to call us prior to adding any additional medications including prescriptive or OTC medications and the importance of keeping follow-up appointments.   5. Avoid sharing toothbrushes and dental or shaving equipment, and be cautioned to cover any bleeding wound to prevent the possibility of others coming into contact with their blood. No donating blood. Clean blood spills with 1part bleach to 9 parts water wearing gloves.   We will see you back in 8 weeks. This will be at the end of treatment. You will also have labs completed prior to the visit.   Aliene Altes, PA-C [facility]

## 2018-11-05 ENCOUNTER — Encounter: Payer: Self-pay | Admitting: Gastroenterology

## 2018-11-05 ENCOUNTER — Other Ambulatory Visit: Payer: Self-pay

## 2018-11-05 ENCOUNTER — Ambulatory Visit (INDEPENDENT_AMBULATORY_CARE_PROVIDER_SITE_OTHER): Payer: Medicaid Other | Admitting: Gastroenterology

## 2018-11-05 DIAGNOSIS — B182 Chronic viral hepatitis C: Secondary | ICD-10-CM

## 2018-11-06 ENCOUNTER — Encounter: Payer: Self-pay | Admitting: Gastroenterology

## 2018-11-07 ENCOUNTER — Telehealth: Payer: Self-pay | Admitting: Gastroenterology

## 2018-11-07 ENCOUNTER — Other Ambulatory Visit: Payer: Self-pay

## 2018-11-07 DIAGNOSIS — Z8619 Personal history of other infectious and parasitic diseases: Secondary | ICD-10-CM

## 2018-11-07 NOTE — Telephone Encounter (Signed)
Doris, can you call patient to see if he has been able to get his blood drawn? He had labs drawn prior to his office visit, but HCV RNA was missed for some reason. I placed the order during the visit, but it doesn't look like he has completed this yet.   Also, he already has labs placed for 8 weeks, but I also need him to have HFP only in 4 weeks due to history of Hep B. Can you place this lab and a reminder to call patient?

## 2018-11-10 NOTE — Telephone Encounter (Signed)
LMOM to call.

## 2018-11-10 NOTE — Progress Notes (Signed)
CC'D TO PCP °

## 2018-11-17 ENCOUNTER — Other Ambulatory Visit: Payer: Self-pay

## 2018-11-17 DIAGNOSIS — Z8619 Personal history of other infectious and parasitic diseases: Secondary | ICD-10-CM

## 2018-11-17 NOTE — Telephone Encounter (Signed)
Pt said the lab had a lot of people in it the day he went and his sister had something she had to do, so he did not wait.  He is planning to go today.  He is aware to do HFP in 4 weeks and I am mailing him the order today.  He is also aware he will repeat the labs 8 weeks from his OV and we will mail those.

## 2018-11-17 NOTE — Telephone Encounter (Signed)
Noted  

## 2018-11-22 LAB — BASIC METABOLIC PANEL
BUN/Creatinine Ratio: 9 — ABNORMAL LOW (ref 10–24)
BUN: 7 mg/dL — ABNORMAL LOW (ref 8–27)
CO2: 24 mmol/L (ref 20–29)
Calcium: 9 mg/dL (ref 8.6–10.2)
Chloride: 101 mmol/L (ref 96–106)
Creatinine, Ser: 0.8 mg/dL (ref 0.76–1.27)
GFR calc Af Amer: 111 mL/min/{1.73_m2} (ref >59)
GFR calc non Af Amer: 96 mL/min/{1.73_m2} (ref >59)
Glucose: 76 mg/dL (ref 65–99)
Potassium: 4.8 mmol/L (ref 3.5–5.2)
Sodium: 137 mmol/L (ref 134–144)

## 2018-11-22 LAB — CBC WITH DIFFERENTIAL/PLATELET
Basophils Absolute: 0 10*3/uL (ref 0.0–0.2)
Basos: 1 %
EOS (ABSOLUTE): 0.1 10*3/uL (ref 0.0–0.4)
Eos: 1 %
Hematocrit: 38.6 % (ref 37.5–51.0)
Hemoglobin: 13.5 g/dL (ref 13.0–17.7)
Immature Grans (Abs): 0 10*3/uL (ref 0.0–0.1)
Immature Granulocytes: 0 %
Lymphocytes Absolute: 1.8 10*3/uL (ref 0.7–3.1)
Lymphs: 30 %
MCH: 34.4 pg — ABNORMAL HIGH (ref 26.6–33.0)
MCHC: 35 g/dL (ref 31.5–35.7)
MCV: 99 fL — ABNORMAL HIGH (ref 79–97)
Monocytes Absolute: 0.7 10*3/uL (ref 0.1–0.9)
Monocytes: 12 %
Neutrophils Absolute: 3.3 10*3/uL (ref 1.4–7.0)
Neutrophils: 56 %
Platelets: 323 10*3/uL (ref 150–450)
RBC: 3.92 x10E6/uL — ABNORMAL LOW (ref 4.14–5.80)
RDW: 11.5 % — ABNORMAL LOW (ref 11.6–15.4)
WBC: 5.8 10*3/uL (ref 3.4–10.8)

## 2018-11-22 LAB — HEPATIC FUNCTION PANEL
ALT: 18 IU/L (ref 0–44)
AST: 51 IU/L — ABNORMAL HIGH (ref 0–40)
Albumin: 3.9 g/dL (ref 3.8–4.8)
Alkaline Phosphatase: 64 IU/L (ref 39–117)
Bilirubin Total: 0.3 mg/dL (ref 0.0–1.2)
Bilirubin, Direct: 0.15 mg/dL (ref 0.00–0.40)
Total Protein: 6.6 g/dL (ref 6.0–8.5)

## 2018-11-22 LAB — PROTIME-INR
INR: 1 (ref 0.8–1.2)
Prothrombin Time: 10.5 s (ref 9.1–12.0)

## 2018-11-22 LAB — HCV RNA QUANT: Hepatitis C Quantitation: NOT DETECTED IU/mL

## 2018-11-23 LAB — HCV RNA QUANT: Hepatitis C Quantitation: NOT DETECTED IU/mL

## 2018-11-24 NOTE — Progress Notes (Signed)
HCV RNA Not Detected on 11/21/18 (6.5 weeks into treatment).

## 2018-12-10 ENCOUNTER — Telehealth: Payer: Self-pay

## 2018-12-10 NOTE — Telephone Encounter (Signed)
I called pt's sister, Stanton Kidney, since he said this morning that his phone was turned off and it might be turned back on tonight. She is aware it is OK for him to take the medications that he was prescribed for his hand. She is aware we are working on the [place] and will call when we know something.

## 2018-12-10 NOTE — Telephone Encounter (Signed)
The medications he was started on for the cut on his hand are fine to take with Epclusa. We just need to get him started back on Epclusa ASAP.

## 2018-12-10 NOTE — Telephone Encounter (Signed)
Pt came by the office thinking he had Epclusa. It has not arrived and he took his last pill on Sunday. I have called [facility] [employer] and spoke to Cornelius who is checking on it and will call me back. He is unsure if it is an insurance issue or what but will call back.  Also, pt had cut his hand and went to Urgent Care. He had prescriptions with him to get filled from yesterday, and wants to know if it will be Ok to take with Epclusa. 1. Keflex 500 mg tid x 7 days 2. Phenergan 25 mg and to take 12.5 mg as directed 3. Motrin 400 mg q 8 hr as needed.   Waiting on return call from [place] and them will route to Aliene Altes, [place] for advice.

## 2018-12-10 NOTE — Progress Notes (Signed)
PT came by the office and is aware of his results and plan. He is aware that we will mail lab orders before due and he will do around the due date.

## 2018-12-10 NOTE — Telephone Encounter (Signed)
Alicia had Vm for me to call in reference to pt's medication. I called [facility] @ [phone] and left vm for a return call. I called [phone] and spoke to [facility] who asked me to fax recent OV notes and labs to do another PA. The other PA expired when they could not get in touch with pt.  I have faxed those notes/labs to [phone] with a note to please [employer] since pt took last pill on 12/07/2018.

## 2018-12-10 NOTE — Telephone Encounter (Signed)
I called back to see what Dustin Booker has found out. He said they made multiple attempts ( 5) to contact pt. They are suppose to ask pt questions before delivering medication to the office. Since they were unable to contact pt they have to update chart, etc. He said that Dustin Booker who works on the prior authorizations is working on this. He will send her a message to get in touch with me.  Sending FYI to Aliene Altes, [place] .

## 2018-12-11 NOTE — Telephone Encounter (Signed)
I spoke with Audrea Muscat at [facility]. The office notes and labs were received and the PA team is working on it. She said it usually takes 24-48 hours to get approved for expedited order. She said they have spoken to the pt's sister and informed her.

## 2018-12-15 ENCOUNTER — Other Ambulatory Visit: Payer: Self-pay

## 2018-12-15 DIAGNOSIS — B182 Chronic viral hepatitis C: Secondary | ICD-10-CM

## 2018-12-15 NOTE — Telephone Encounter (Signed)
I called [facility] and spoke to [facility] and she said they are waiting on authorization from insurance. She will check with them and let me know what to expect.

## 2018-12-15 NOTE — Telephone Encounter (Signed)
Great. Patient needs to come pick medication up when it arrives and start taking as prescribed to complete the last 4 weeks of treatment. We will plan to repeat his labs at the end of treatment as already planned to ensure that his Hepatitis C has been cleared. This will now be around October 13th. Patient needs to be sure he copmpletes the entire course before getting his labs drawn.   Also, we need to push patients next office visit out to around of October 21st or after due to this delay in receiving the third shipment of Epclusa. He should have completed treatment and have labs drawn by that time.

## 2018-12-15 NOTE — Telephone Encounter (Signed)
Received a call from [place], shipment of Dustin Booker is scheduled for delivery to our office on tomorrow 12/16/2018.

## 2018-12-16 NOTE — Telephone Encounter (Signed)
I called pt and told him the medication should be here today, but to not come until I call him. Just wanted him to be able to plan to come. He said if I could not get him to call his sister and he will let her know to expect the call.

## 2018-12-16 NOTE — Telephone Encounter (Signed)
Pt came by and picked up his Epclusa. He takes in the Am so he will start back tomorrow morning. He has not added any new meds since we last talked ( the ones for his finger). He was given new appointment for 01/21/2019 and also given the lab orders to do AFTER he completes the pills, but before his office visit. He will call if any problems.

## 2018-12-16 NOTE — Telephone Encounter (Signed)
PT's sister is aware that the medication has arrived and she will bring him to pick it up.

## 2018-12-22 NOTE — Telephone Encounter (Signed)
Noted  

## 2019-01-07 ENCOUNTER — Ambulatory Visit: Payer: Medicaid Other | Admitting: Gastroenterology

## 2019-01-20 NOTE — Progress Notes (Signed)
Referring Provider: Sandi Mealy, MD Primary Care Physician:  Sandi Mealy, MD Primary GI Physician: Dr. Oneida Alar  Chief Complaint  Patient presents with  . Follow-up    HPI:   Dustin Booker is a 63 y.o. male presenting today with a history of chronic hepatitis C, genotype 2B, baseline HCVRNA 64, 446, Metavir score F2/F3 with need for annual ultrasound surveillance.  Also with history of prior hepatitis B infection with positive hep B core antibody and surface antibody, negative surface antigen.  He was last seen in our office on 11/06/2018 for follow-up of hepatitis C undergoing treatment with Epclusa.  Treatment was started on 10/06/2018.  He was doing well at that time taking his medication daily and without any side effects.  No obvious signs or symptoms of advanced liver disease.  He had labs drawn prior to that visit but HCVRNA was not ordered.  His CMP was largely normal other than mild microcytosis, BMP essentially normal, HFP normal, and INR normal.  Plans to continue Epclusa, recheck HCV RNA quantitative, HFP in 4 weeks with history of prior hep B, and follow-up in 8 weeks at the end of treatment with labs prior to visit.  Labs completed on 11/24/2018 with HCV RNA not detected, HFP with slight elevation of AST at 51 otherwise normal. Hemoglobin and platelets normal. BMP essentially normal. INR normal.   Telephone note on 12/10/2018 reporting patient coming to the office to pick up Epclusa that had not arrived yet.  Patient had taken his last dose of Epclusa on 12/07/2018.  There was an issue with the medical supply company getting in touch with the patient to answer certain questions before delivering the medication.  Since they were not able to get in touch with the patient, his prior PA expired and another PA had to be submitted.  Ultimately, [name] was delivered on 12/16/2018 and patient picked medication the same day.  Recommended labs prior to office visit have not yet been  completed.  Today he states he has completed Epclusa. Last dose was 01/19/19. States he is doing well. No acute complaints. No abdominal pain, no swelling in lower legs or abdomen, no yellowing eyes or skin,  confusion, or dark urine. BMs daily. No constipation or diarrhea. No blood in the stool or black stools. No GERD symptoms, nausea, vomiting, or trouble swallowing. Has gained about 10 lbs since last visit.     Started drinking again a couple months ago. Drinking 6 pack of beer a day. Marijuana 1-2 times a week. No IV or intranasal.    Not interested in colonoscopy at this time.   No fever, chills, lightheadedness, dizziness, or feeling like he will pass out. No chest pain, palpitations. Occasional exertional SOB. No SOB at rest. Occasional cough likely related to smoking.    Past Medical History:  Diagnosis Date  . Alcohol abuse   . Chronic hepatitis C (HCC)    Genotype 2B  . Dyslexia   . Monoclonal gammopathy     Past Surgical History:  Procedure Laterality Date  . COLON SURGERY  09/2017   Burke Keels.    Current Outpatient Medications  Medication Sig Dispense Refill  . b complex vitamins capsule Take 1 capsule by mouth. 2-3 times per week     No current facility-administered medications for this visit.     Allergies as of 01/21/2019  . (No Known Allergies)    Family History  Problem Relation Age of Onset  . Colon cancer  Neg Hx     Social History   Socioeconomic History  . Marital status: Single    Spouse name: Not on file  . Number of children: Not on file  . Years of education: Not on file  . Highest education level: Not on file  Occupational History  . Not on file  Social Needs  . Financial resource strain: Not on file  . Food insecurity    Worry: Not on file    Inability: Not on file  . Transportation needs    Medical: Not on file    Non-medical: Not on file  Tobacco Use  . Smoking status: Current Every Day Smoker    Packs/day: 0.50    Types:  Cigarettes  . Smokeless tobacco: Never Used  Substance and Sexual Activity  . Alcohol use: Yes    Comment: 6 pack of beer a day  . Drug use: Yes    Types: Marijuana  . Sexual activity: Not on file  Lifestyle  . Physical activity    Days per week: Not on file    Minutes per session: Not on file  . Stress: Not on file  Relationships  . Social Herbalist on phone: Not on file    Gets together: Not on file    Attends religious service: Not on file    Active member of club or organization: Not on file    Attends meetings of clubs or organizations: Not on file    Relationship status: Not on file  Other Topics Concern  . Not on file  Social History Narrative  . Not on file    Review of Systems: Gen: See HPI  CV: See HPI Resp: See HPI GI: See HPI Derm: Denies rash Psych: Denies depression or anxiety Heme: Denies bruising, bleeding  Physical Exam: BP 118/69   Pulse 94   Temp (!) 96.6 F (35.9 C)   Ht 5' 11.5" (1.816 m)   Wt 128 lb 6.4 oz (58.2 kg)   BMI 17.66 kg/m  General:   Alert and oriented. No distress noted. Pleasant and cooperative.  Head:  Normocephalic and atraumatic. Eyes:  Conjuctiva clear without scleral icterus. Heart:  S1, S2 present without murmurs appreciated. Lungs:  Clear to auscultation bilaterally. No wheezes, rales, or rhonchi. No distress.  Abdomen:  +BS, soft, non-tender and non-distended. No rebound or guarding. No HSM or masses noted. Msk:  Symmetrical without gross deformities. Normal posture. Extremities:  Without edema. Neurologic:  Alert and  oriented x4 Psych: Normal mood and affect.

## 2019-01-21 ENCOUNTER — Other Ambulatory Visit: Payer: Self-pay

## 2019-01-21 ENCOUNTER — Other Ambulatory Visit: Payer: Self-pay | Admitting: Gastroenterology

## 2019-01-21 ENCOUNTER — Ambulatory Visit (INDEPENDENT_AMBULATORY_CARE_PROVIDER_SITE_OTHER): Payer: Medicaid Other | Admitting: Gastroenterology

## 2019-01-21 ENCOUNTER — Telehealth: Payer: Self-pay | Admitting: Gastroenterology

## 2019-01-21 ENCOUNTER — Encounter: Payer: Self-pay | Admitting: Gastroenterology

## 2019-01-21 VITALS — BP 118/69 | HR 94 | Temp 96.6°F | Ht 71.5 in | Wt 128.4 lb

## 2019-01-21 DIAGNOSIS — K7400 Hepatic fibrosis, unspecified: Secondary | ICD-10-CM

## 2019-01-21 DIAGNOSIS — B182 Chronic viral hepatitis C: Secondary | ICD-10-CM | POA: Diagnosis not present

## 2019-01-21 DIAGNOSIS — Z1211 Encounter for screening for malignant neoplasm of colon: Secondary | ICD-10-CM | POA: Diagnosis not present

## 2019-01-21 NOTE — Assessment & Plan Note (Signed)
63 y.o. make with history of alcohol abuse and hepatitis C s/p Epclusa treatment completed on 01/19/19. [place] with elastography revealed Metavir F2/F3 with need for annual [place] surveillance. He is doing well at this time with no signs/sympotms of advanced liver disease. No significant upper or lower GI symptoms. Last HVC RNA Quant undetected on 11/24/18. He had abstained from alcohol for a short time during Hep C treatment; however, he admits to resuming alcohol consumption about 2 months ago. Drinking about 6 beer a day. I discussed with him the risk of fibrosis progressing to cirrhosis and the influence that alcohol has on his liver. Urged patient to work towards abstinence once again. Patient voiced his understanding. He denies any IV or intranasal drug use.   He is due for surveillance [place] at this time. Orders have been placed.  Patient will receive a call with date and time.  Follow-up in 1 year unless [place] results suggest need for return sooner.

## 2019-01-21 NOTE — Telephone Encounter (Signed)
ADDED HIM TO THE RECALL FOR 1 YR FOLLOW UP

## 2019-01-21 NOTE — Patient Instructions (Signed)
PA for [place] abd RUQ submitted via [employer]. Case approved. PA# [id number], valid 01/21/19-07/20/19.

## 2019-01-21 NOTE — Telephone Encounter (Signed)
Can we make follow-up appointment for 1 year? I forgot to write this on the encounter form.

## 2019-01-21 NOTE — Assessment & Plan Note (Signed)
No prior colonoscopy. No alarm symptoms. Patient isn't interested in colonoscopy at this time.

## 2019-01-21 NOTE — Telephone Encounter (Signed)
Noted  

## 2019-01-21 NOTE — Progress Notes (Signed)
cc'ed to pcp °

## 2019-01-21 NOTE — Patient Instructions (Signed)
1. Please have your labs completed as soon as possible.   2. I am placing an order for an [place] of your liver. You should receive a call with a date and a time. This is to continue to monitor the slight scarring of your liver that was seen prior to Hep C treatment.   3. Please work towards abstinence of alcohol again. This is very important to prevent any further damage of your liver.   4. You will also need labs again in 12 weeks to ensure your Hepatitis C remains undetected.   Aliene Altes, PA-C [facility]

## 2019-01-21 NOTE — Assessment & Plan Note (Addendum)
63 y.o male with history of chronic hepatitis C, genotype 2b, baseline HCV RNA 64,446, Matavir score F2/F3 with need for annual [place] surveillance. Immune to Hep A and B. Received Hep B vaccination. He does have history of prior Hep B infection with positive Hep B core Ab, surface Ag negative. He has completed 12 weeks of Epclusa. Last dose on 01/19/19. Last HCV RNA check on 11/24/18 was not detected. HFP at that time with slight elevation of AST at 51.  Hemoglobin and platelets normal. BMP essentially normal. INR normal. He did miss a few days of Epclusa before starting his last 4 weeks of treatment due to a communication issue. He is doing well at this time with no signs/symptoms of advanced liver disease. No significant upper or lower GI symptoms. Reports he stated drinking again about 2 months ago. Drinking about a 6 pack of beer a day. Denies IV or intranasal drug use.  Patient needs to complete post treatment labs (CBC, BMP, HFP, INR, HCV RNA Quant). They have already been placed in the system.  Will also place order for surveillance [place] at this time.  If HCV RNA is undetected, he will need HCV RNA Quant in 12 weeks to ensure HCV remains undetected.  Advised patient to work back towards abstinence from alcohol. Explained possibility of progressing to cirrhosis with known fibrosis on prior US.  Explained that just because he has been treated for HCV, he is not immune and can still get infected with HCV if engaging in risky behaviors including IV or intranasal drug use, comes into contact with someone's blood who has Hep C, or obtains tattoos in an unregulated setting.  Follow-up in 1 year unless lab findings or [place] suggest otherwise. Patient to call if he has questions or concerns prior.

## 2019-01-22 NOTE — Progress Notes (Signed)
Overall labs look good. Liver function tests are within normal limits. INR normal. CBC with normal hemoglobin and platelet count.   We are waiting on the HCV RNA to result.

## 2019-01-23 LAB — CBC/DIFF AMBIGUOUS DEFAULT
Basophils Absolute: 0.1 10*3/uL (ref 0.0–0.2)
Basos: 1 %
EOS (ABSOLUTE): 0.2 10*3/uL (ref 0.0–0.4)
Eos: 4 %
Hematocrit: 41.5 % (ref 37.5–51.0)
Hemoglobin: 13.6 g/dL (ref 13.0–17.7)
Immature Grans (Abs): 0 10*3/uL (ref 0.0–0.1)
Immature Granulocytes: 0 %
Lymphocytes Absolute: 1.9 10*3/uL (ref 0.7–3.1)
Lymphs: 29 %
MCH: 33.3 pg — ABNORMAL HIGH (ref 26.6–33.0)
MCHC: 32.8 g/dL (ref 31.5–35.7)
MCV: 102 fL — ABNORMAL HIGH (ref 79–97)
Monocytes Absolute: 0.7 10*3/uL (ref 0.1–0.9)
Monocytes: 10 %
Neutrophils Absolute: 3.6 10*3/uL (ref 1.4–7.0)
Neutrophils: 56 %
Platelets: 343 10*3/uL (ref 150–450)
RBC: 4.08 x10E6/uL — ABNORMAL LOW (ref 4.14–5.80)
RDW: 11.8 % (ref 11.6–15.4)
WBC: 6.4 10*3/uL (ref 3.4–10.8)

## 2019-01-23 LAB — HEPATIC FUNCTION PANEL
ALT: 10 IU/L (ref 0–44)
AST: 24 IU/L (ref 0–40)
Albumin: 4 g/dL (ref 3.8–4.8)
Alkaline Phosphatase: 69 IU/L (ref 39–117)
Bilirubin Total: <0.2 mg/dL (ref 0.0–1.2)
Bilirubin, Direct: 0.08 mg/dL (ref 0.00–0.40)
Total Protein: 6.8 g/dL (ref 6.0–8.5)

## 2019-01-23 LAB — PROTIME-INR
INR: 1 (ref 0.9–1.2)
Prothrombin Time: 10.4 s (ref 9.1–12.0)

## 2019-01-23 LAB — HCV RNA BY PCR, QN RFX GENO: HCV Quant Baseline: NOT DETECTED IU/mL

## 2019-01-23 LAB — BASIC METABOLIC PANEL
BUN/Creatinine Ratio: 8 — ABNORMAL LOW (ref 10–24)
BUN: 6 mg/dL — ABNORMAL LOW (ref 8–27)
CO2: 22 mmol/L (ref 20–29)
Calcium: 8.9 mg/dL (ref 8.6–10.2)
Chloride: 108 mmol/L — ABNORMAL HIGH (ref 96–106)
Creatinine, Ser: 0.72 mg/dL — ABNORMAL LOW (ref 0.76–1.27)
GFR calc Af Amer: 116 mL/min/{1.73_m2} (ref >59)
GFR calc non Af Amer: 101 mL/min/{1.73_m2} (ref >59)
Glucose: 83 mg/dL (ref 65–99)
Potassium: 4.3 mmol/L (ref 3.5–5.2)
Sodium: 142 mmol/L (ref 134–144)

## 2019-01-23 LAB — SPECIMEN STATUS REPORT

## 2019-01-23 NOTE — Progress Notes (Signed)
Hep C Virus RNA not detected. We need to recheck HCV RNA Quant in 3 months.   Doris, can you arrange this?

## 2019-01-26 ENCOUNTER — Other Ambulatory Visit: Payer: Self-pay

## 2019-01-26 DIAGNOSIS — B182 Chronic viral hepatitis C: Secondary | ICD-10-CM

## 2019-01-28 ENCOUNTER — Ambulatory Visit (HOSPITAL_COMMUNITY): Payer: Medicaid Other

## 2019-02-02 ENCOUNTER — Ambulatory Visit (HOSPITAL_COMMUNITY)
Admission: RE | Admit: 2019-02-02 | Discharge: 2019-02-02 | Disposition: A | Discharge status: Home / Self Care | Payer: Medicaid Other | Source: Ambulatory Visit | Attending: Gastroenterology | Admitting: Gastroenterology

## 2019-02-02 ENCOUNTER — Other Ambulatory Visit: Payer: Self-pay

## 2019-02-02 DIAGNOSIS — K7400 Hepatic fibrosis, unspecified: Secondary | ICD-10-CM | POA: Diagnosis present

## 2019-02-02 DIAGNOSIS — B182 Chronic viral hepatitis C: Secondary | ICD-10-CM | POA: Insufficient documentation

## 2019-02-05 ENCOUNTER — Other Ambulatory Visit: Payer: Self-pay

## 2019-02-05 DIAGNOSIS — B1920 Unspecified viral hepatitis C without hepatic coma: Secondary | ICD-10-CM

## 2019-02-05 NOTE — Progress Notes (Signed)
He

## 2019-03-16 ENCOUNTER — Other Ambulatory Visit: Payer: Self-pay

## 2019-03-16 DIAGNOSIS — B182 Chronic viral hepatitis C: Secondary | ICD-10-CM

## 2019-04-28 ENCOUNTER — Other Ambulatory Visit: Payer: Self-pay

## 2019-04-28 DIAGNOSIS — B1920 Unspecified viral hepatitis C without hepatic coma: Secondary | ICD-10-CM

## 2019-06-01 IMAGING — US US ABDOMEN LIMITED W/ ELASTOGRAPHY
1 series · 13 of 25 positions shown · non-contrast
Comparison: None.

CLINICAL DATA: Chronic hepatitis-C

EXAM:
US ABDOMEN LIMITED - RIGHT UPPER QUADRANT
ULTRASOUND HEPATIC ELASTOGRAPHY
TECHNIQUE: Limited right upper quadrant abdominal ultrasound was performed. In
addition, ultrasound elastography evaluation of the liver was
performed. A region of interest was placed in the right lobe of the
liver. Following application of a compressive sonographic pulse,
shear waves were detected in the adjacent hepatic tissue and the
shear wave velocity was calculated. Multiple assessments were
performed at the selected site. Median shear wave velocity is
correlated to a Metavir fibrosis score.

[Series 1: us abdomen limited w/ elastography · 13 of 77 slices shown]
[im 1/77]
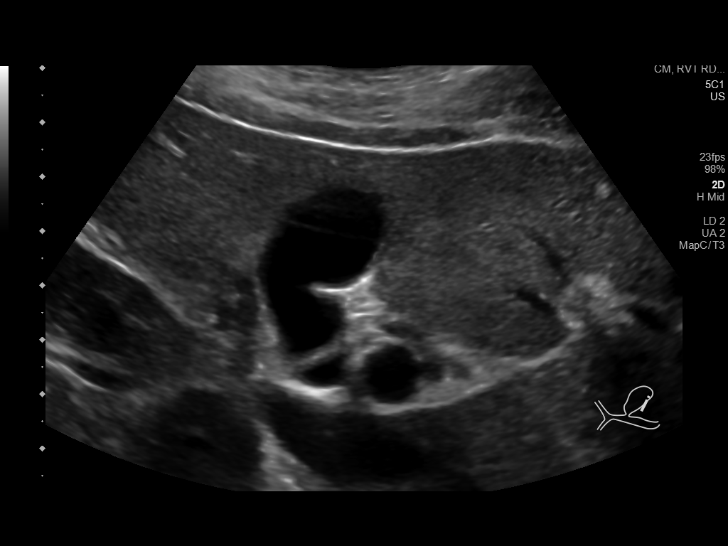
[im 7/77]
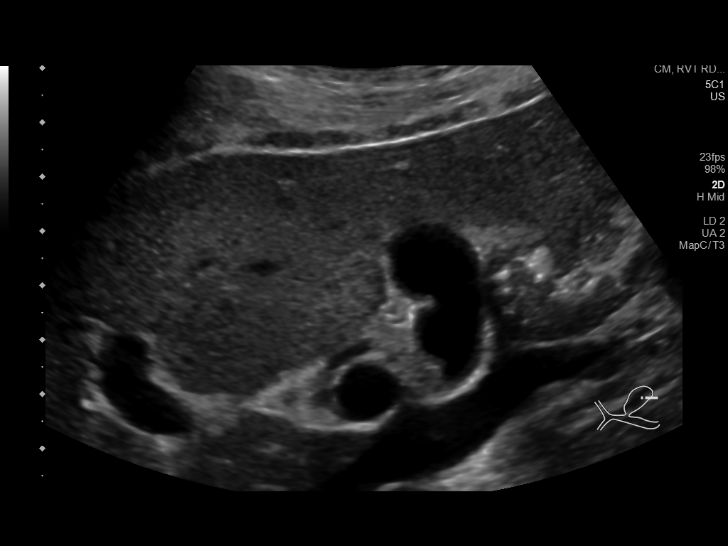
[im 13/77]
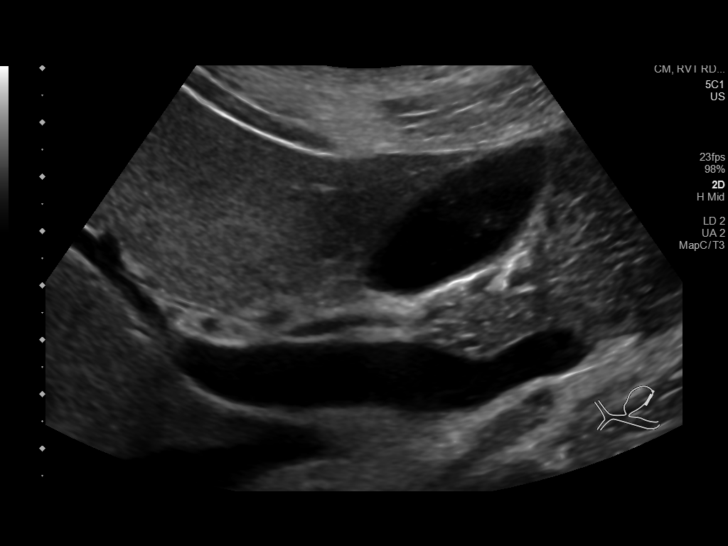
[im 20/77]
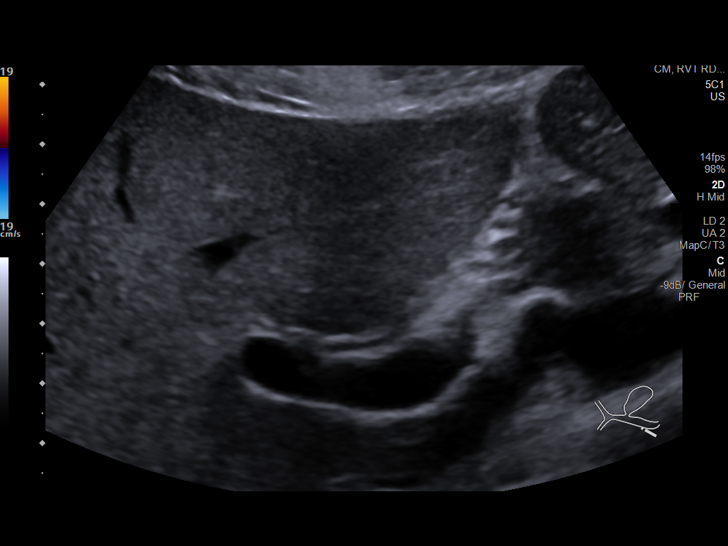
[im 26/77]
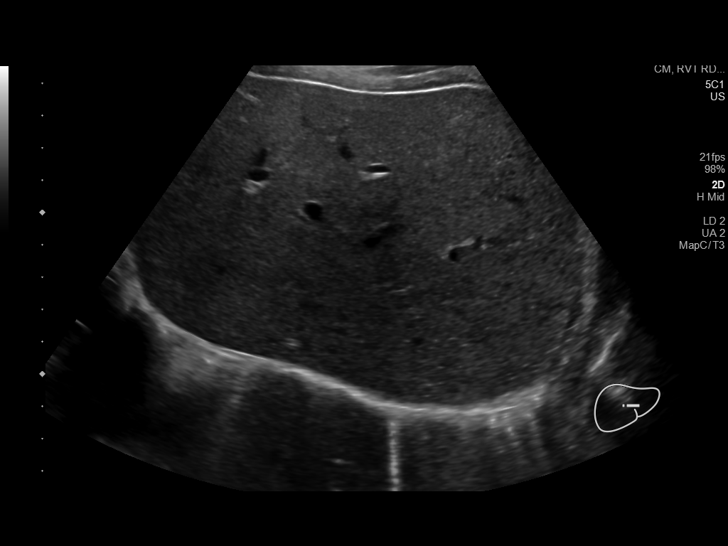
[im 32/77]
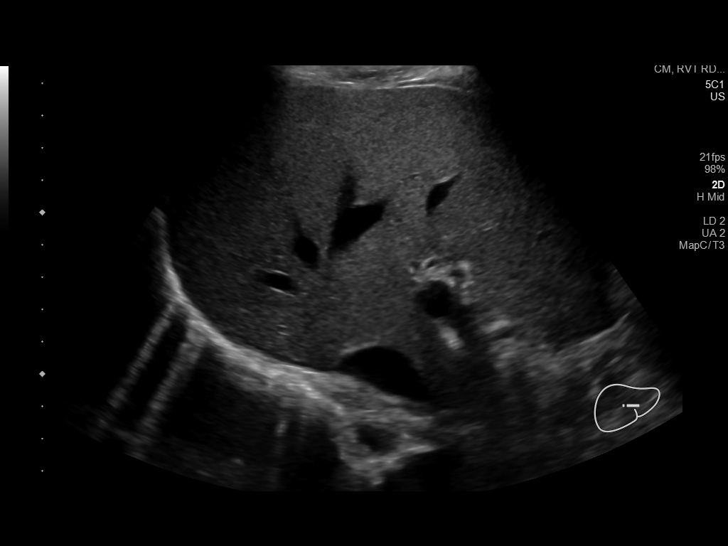
[im 39/77]
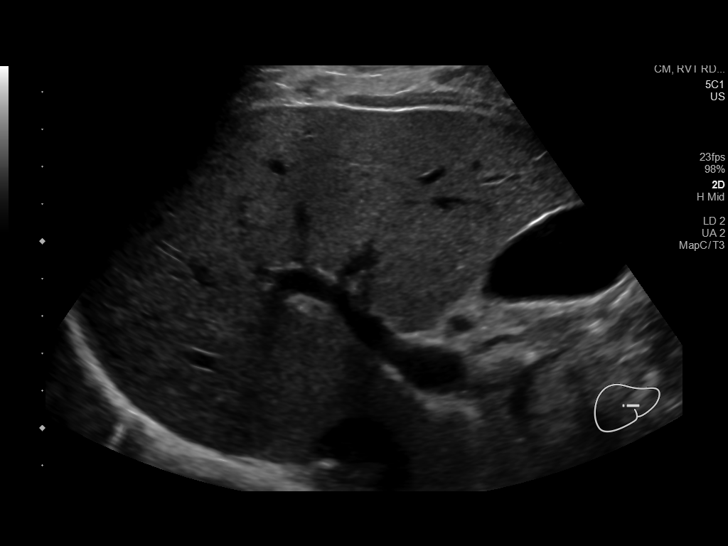
[im 45/77]
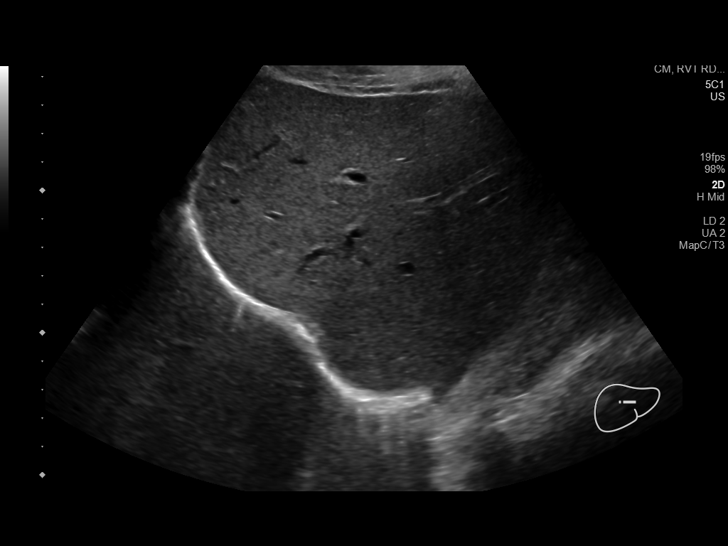
[im 51/77]
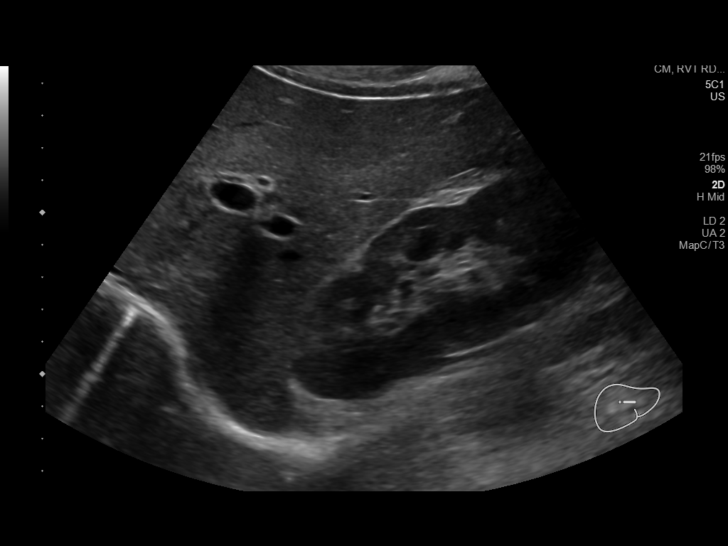
[im 58/77]
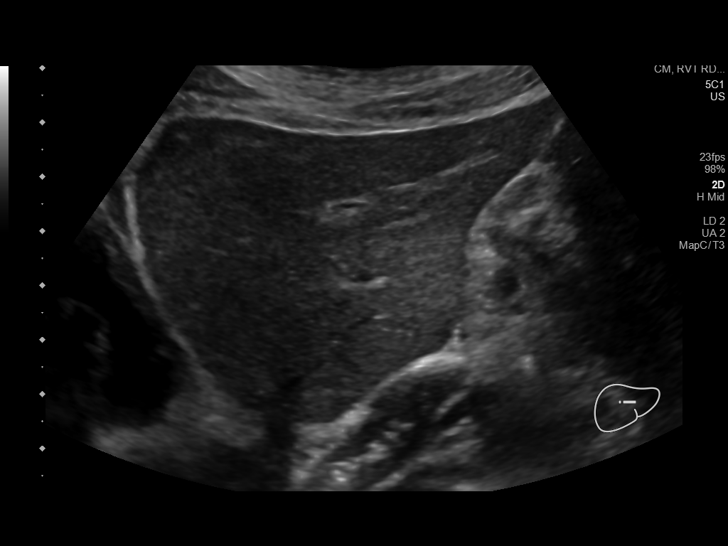
[im 64/77]
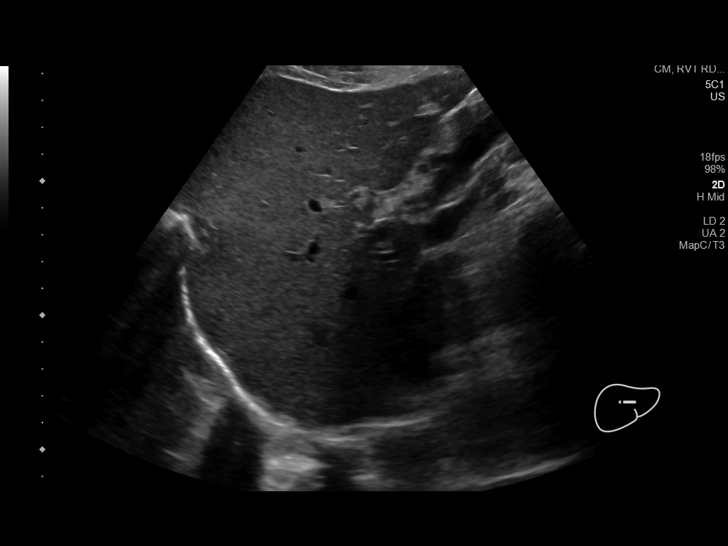
[im 70/77]
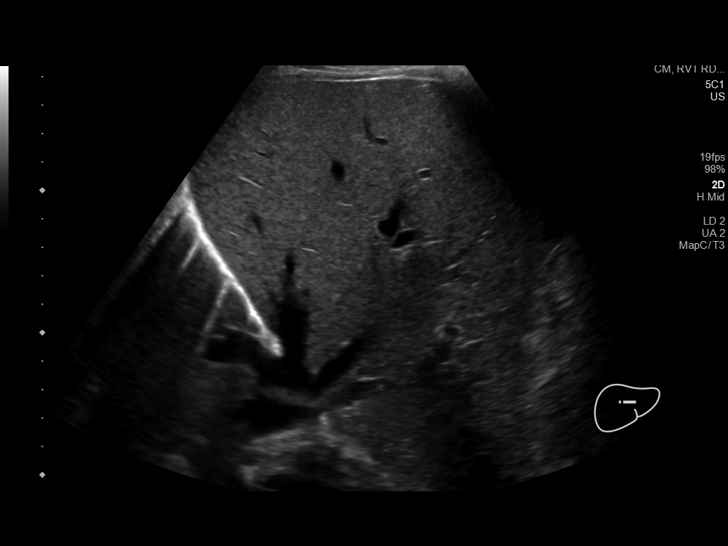
[im 77/77]
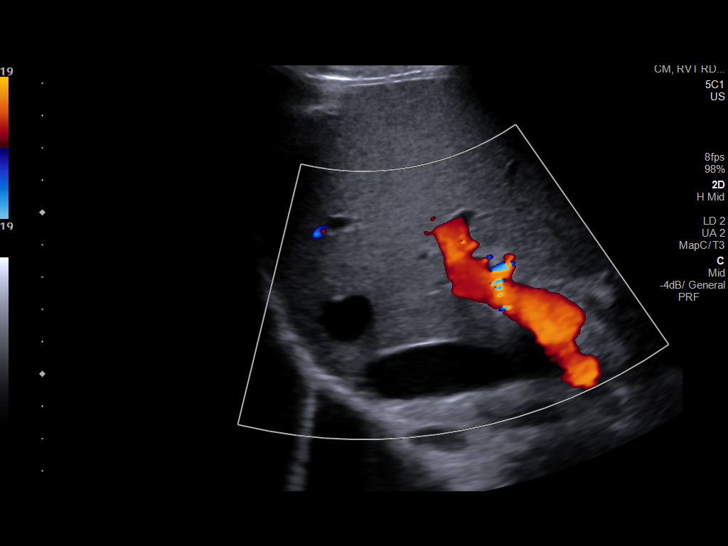

[13 of 25 positions shown; findings below may reference images not displayed]

FINDINGS: ULTRASOUND ABDOMEN LIMITED RIGHT UPPER QUADRANT

Gallbladder:

No gallstones or wall thickening visualized. No sonographic Murphy
sign noted.

Common bile duct:

Diameter: 1.6 mm

Liver:

Normal echogenicity without focal lesion or biliary dilatation.
Portal vein is patent on color Doppler imaging with normal direction
of blood flow towards the liver.

ULTRASOUND HEPATIC ELASTOGRAPHY

Device: Siemens Helix VTQ

Patient position: Oblique

Transducer [id number]

Number of measurements: 10

Hepatic segment:  8

Median velocity:   1.48 m/sec

IQR: 0.2 to

IQR/Median velocity ratio:

Corresponding Metavir fibrosis score:  F2 + some F3

Risk of fibrosis: Moderate

Limitations of exam: None

Please note that abnormal shear wave velocities may also be
identified in clinical settings other than with hepatic fibrosis,
such as: acute hepatitis, elevated right heart and central venous
pressures including use of beta blockers, Raia disease
(Ernanes), infiltrative processes such as
mastocytosis/amyloidosis/infiltrative tumor, extrahepatic
cholestasis, in the post-prandial state, and liver transplantation.
Correlation with patient history, laboratory data, and clinical
condition recommended.
IMPRESSION: ULTRASOUND ABDOMEN: Normal examination

ULTRASOUND HEPATIC ELASTOGRAHY:

Median hepatic shear wave velocity is calculated at 1.48 m/sec.

Corresponding Metavir fibrosis score is F2 + some F3.

Risk of fibrosis is Moderate.

Follow-up: Additional testing appropriate

## 2020-01-11 ENCOUNTER — Encounter: Payer: Self-pay | Admitting: Internal Medicine

## 2020-03-03 ENCOUNTER — Ambulatory Visit: Payer: Medicaid Other | Admitting: Internal Medicine

## 2020-03-03 ENCOUNTER — Encounter: Payer: Self-pay | Admitting: Internal Medicine

## 2020-06-09 IMAGING — US US ABDOMEN LIMITED
1 series · 14 of 25 positions shown · non-contrast
Comparison: None.

CLINICAL DATA: Chronic hepatitis.

EXAM:
ULTRASOUND ABDOMEN LIMITED RIGHT UPPER QUADRANT

[Series 1: us abdomen limited · 0.16mm/px · 14 of 82 slices shown]
[im 1/82]
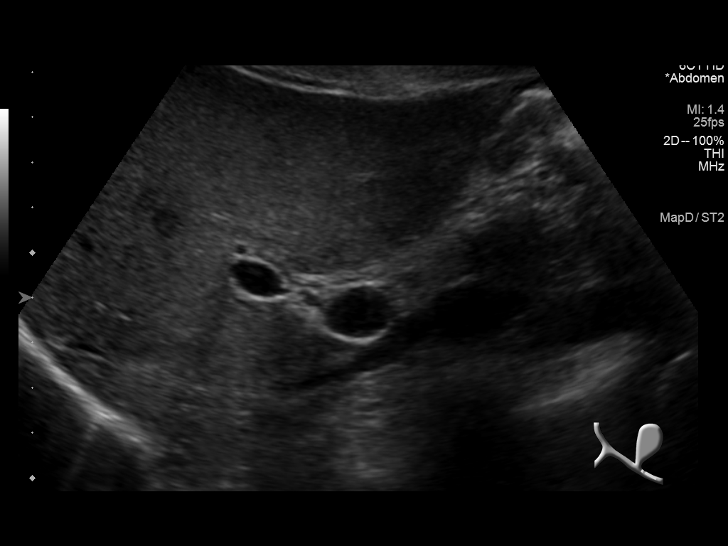
[im 7/82]
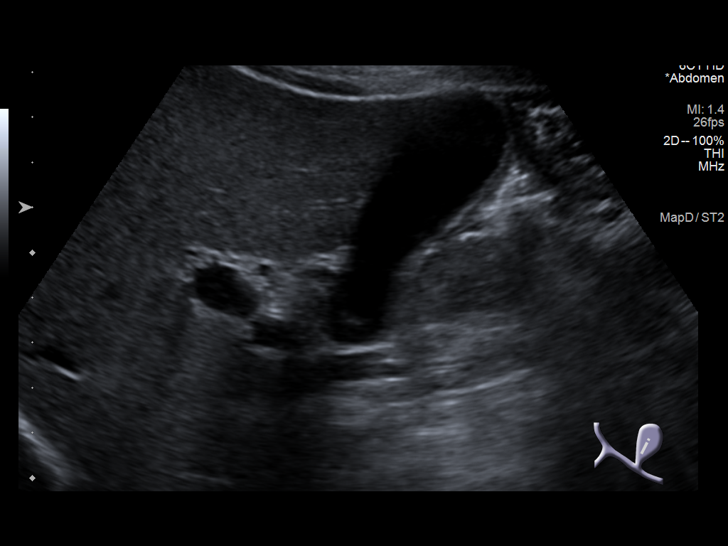
[im 14/82]
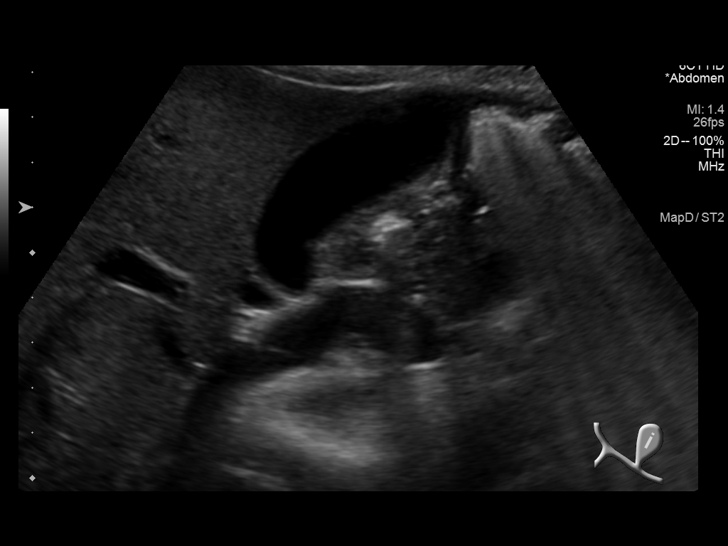
[im 21/82]
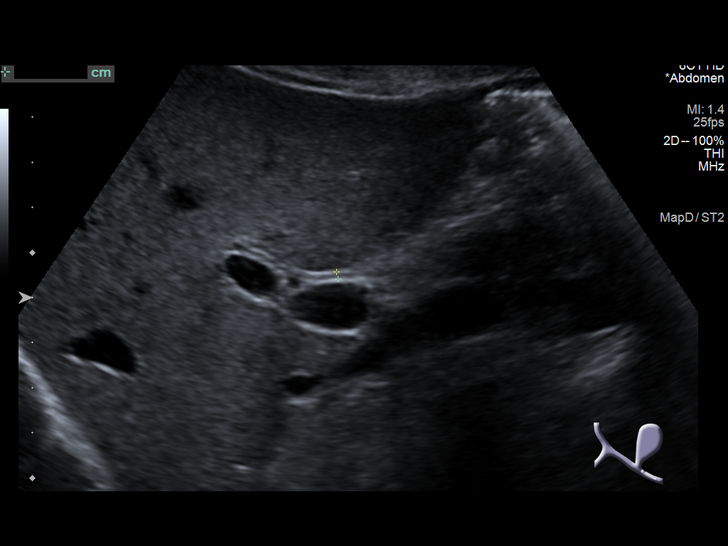
[im 28/82]
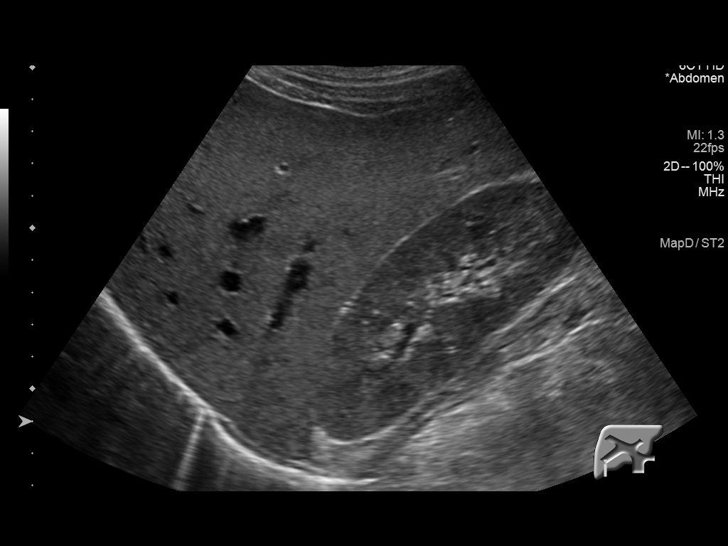
[im 31/82]
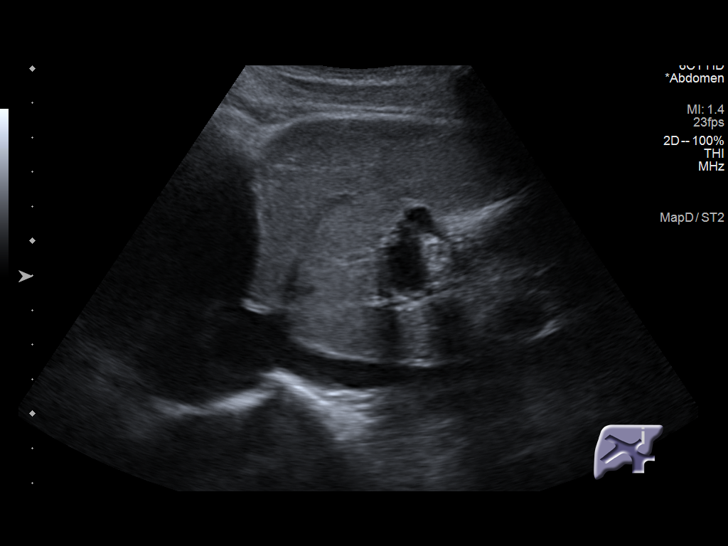
[im 38/82]
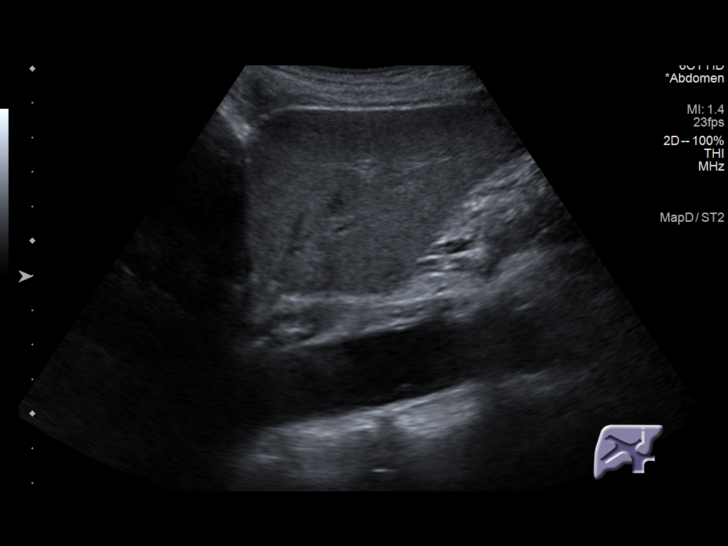
[im 44/82]
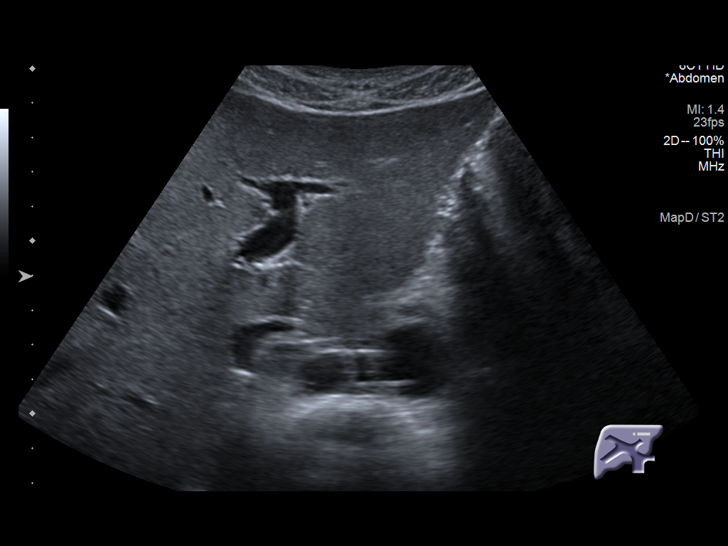
[im 51/82]
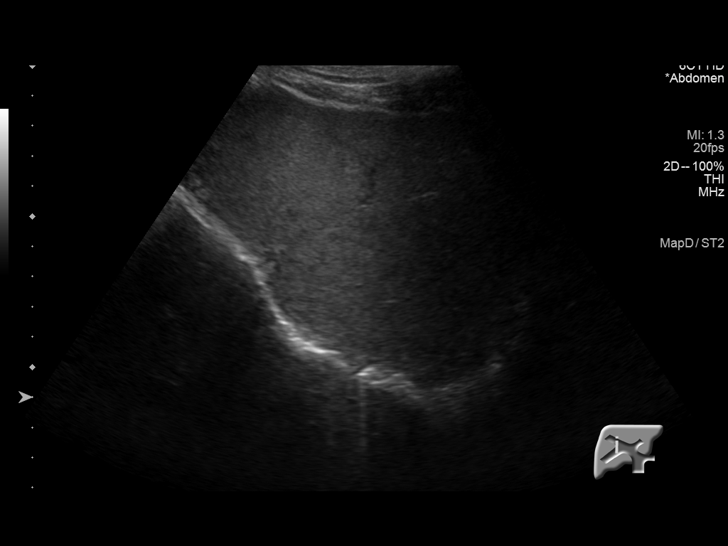
[im 55/82]
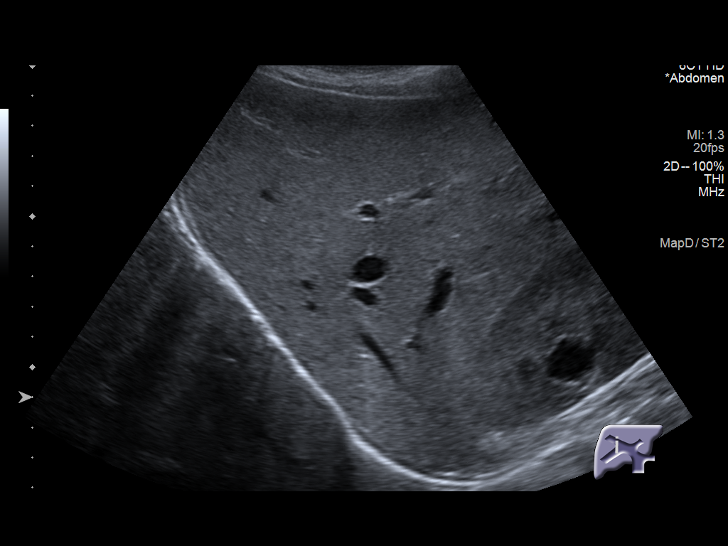
[im 61/82]
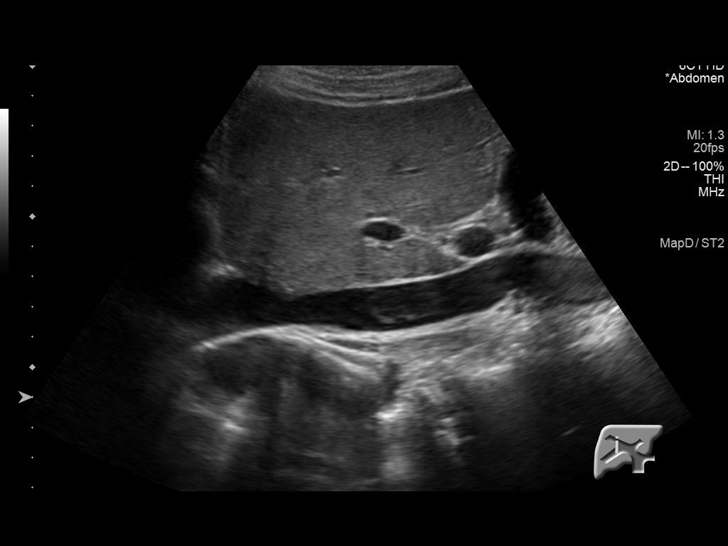
[im 68/82]
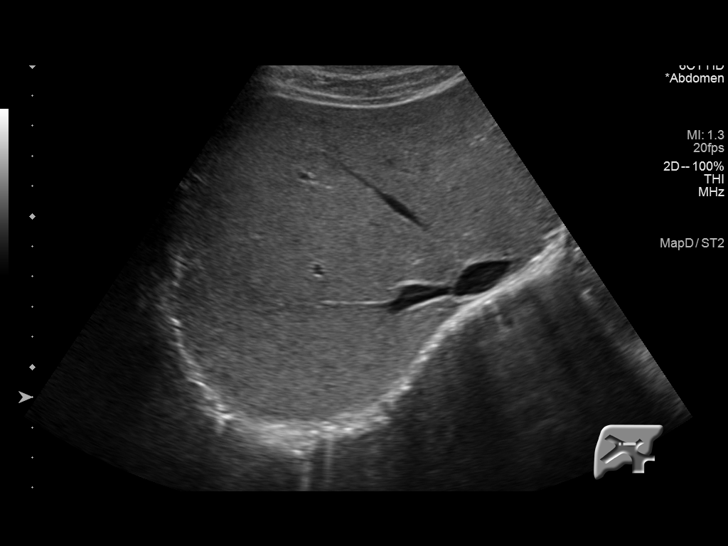
[im 75/82]
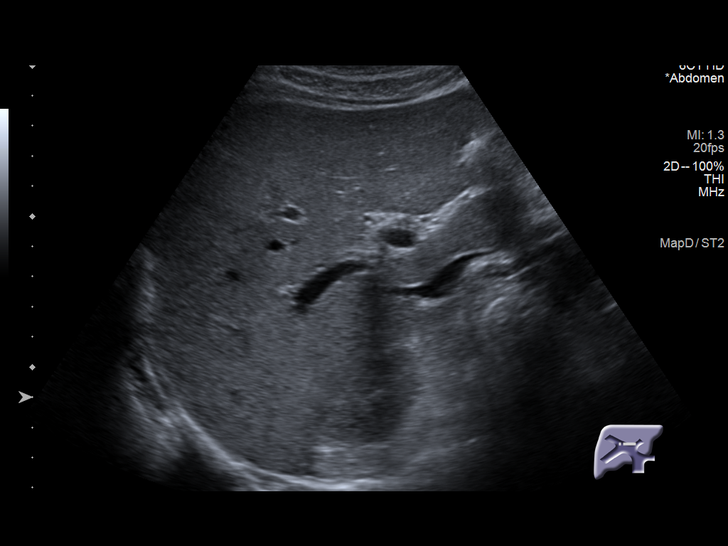
[im 82/82]
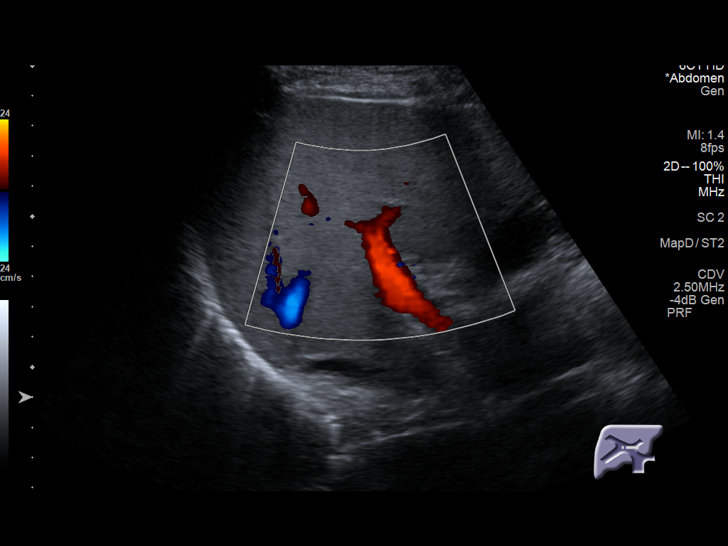

[14 of 25 positions shown; findings below may reference images not displayed]

FINDINGS: Gallbladder:

No gallstones or wall thickening visualized. No sonographic Murphy
sign noted by sonographer.

Common bile duct:

Diameter: 2 mm

Liver:

No focal lesion identified. Within normal limits in parenchymal
echogenicity. Portal vein is patent on color Doppler imaging with
normal direction of blood flow towards the liver.

Other: None.
IMPRESSION: Normal right upper quadrant ultrasound.

## 2021-07-21 ENCOUNTER — Inpatient Hospital Stay
Admission: AD | Admit: 2021-07-21 | Payer: Medicaid Other | Source: Other Acute Inpatient Hospital | Admitting: Internal Medicine

## 2021-09-30 DEATH — deceased
